# Patient Record
Sex: Female | Born: 1935 | Race: White | Hispanic: No
Health system: Southern US, Community
[De-identification: ages and names within clinical notes are randomized; demographics above are authoritative.]

---

## 2003-06-18 ENCOUNTER — Other Ambulatory Visit: Payer: Self-pay

## 2004-02-07 ENCOUNTER — Other Ambulatory Visit: Payer: Self-pay

## 2004-02-23 ENCOUNTER — Inpatient Hospital Stay: Payer: Self-pay | Admitting: General Practice

## 2004-04-17 ENCOUNTER — Inpatient Hospital Stay: Payer: Self-pay | Admitting: General Practice

## 2005-10-03 ENCOUNTER — Inpatient Hospital Stay: Payer: Self-pay | Admitting: Internal Medicine

## 2005-10-12 ENCOUNTER — Other Ambulatory Visit: Payer: Self-pay

## 2005-10-12 ENCOUNTER — Ambulatory Visit: Payer: Self-pay | Admitting: Unknown Physician Specialty

## 2005-10-15 ENCOUNTER — Ambulatory Visit: Payer: Self-pay | Admitting: Unknown Physician Specialty

## 2006-09-07 ENCOUNTER — Emergency Department: Payer: Self-pay | Admitting: Emergency Medicine

## 2006-09-07 ENCOUNTER — Other Ambulatory Visit: Payer: Self-pay

## 2007-05-02 ENCOUNTER — Emergency Department: Payer: Self-pay | Admitting: Unknown Physician Specialty

## 2007-08-26 IMAGING — CR RIGHT ANKLE - COMPLETE 3+ VIEW
1 series · 5 of 5 positions shown · non-contrast
Comparison: none

REASON FOR EXAM: fall  rm 19
COMMENTS:  LMP: Post-Menopausal

PROCEDURE:     DXR - DXR ANKLE RIGHT COMPLETE  - October 03, 2005  [DATE]
RESULT:     There is noted a fracture of the medial and lateral malleolus.
Some soft tissue swelling is noted.  No dislocation is seen.  Bones appear
mildly osteoporotic.

[Series 1: view not recorded · 0.17mm/px · 5 of 5 slices shown]
[im 1/5]
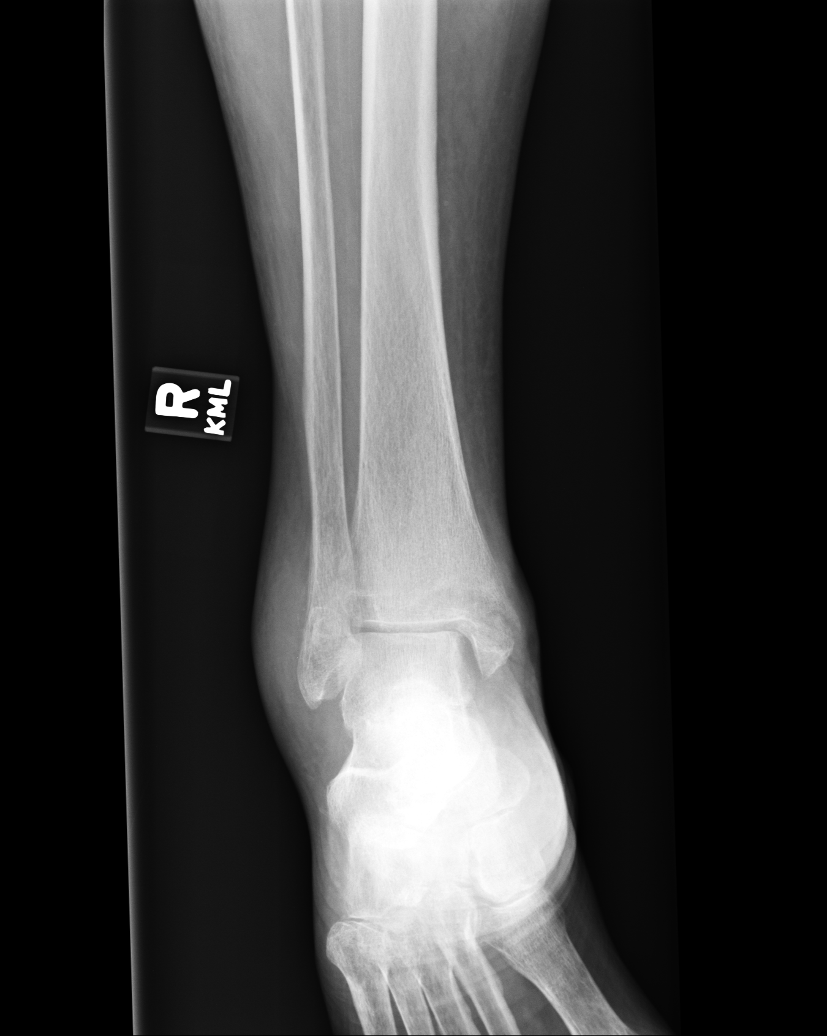
[im 2/5]
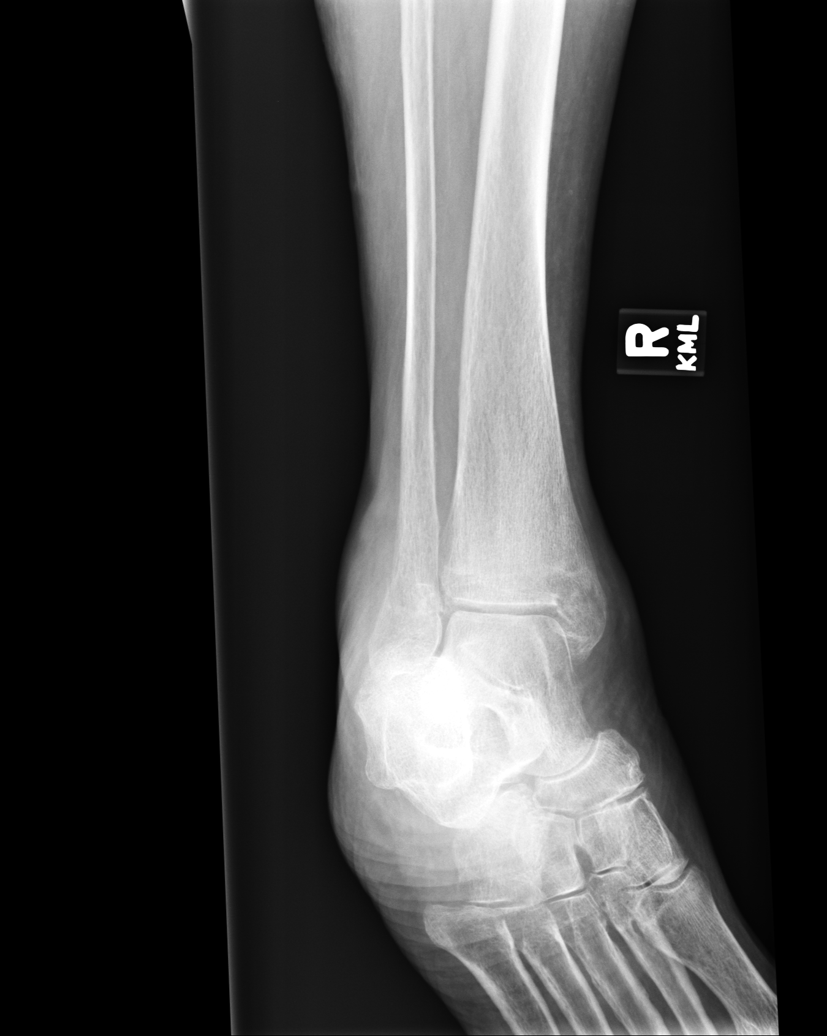
[im 3/5]
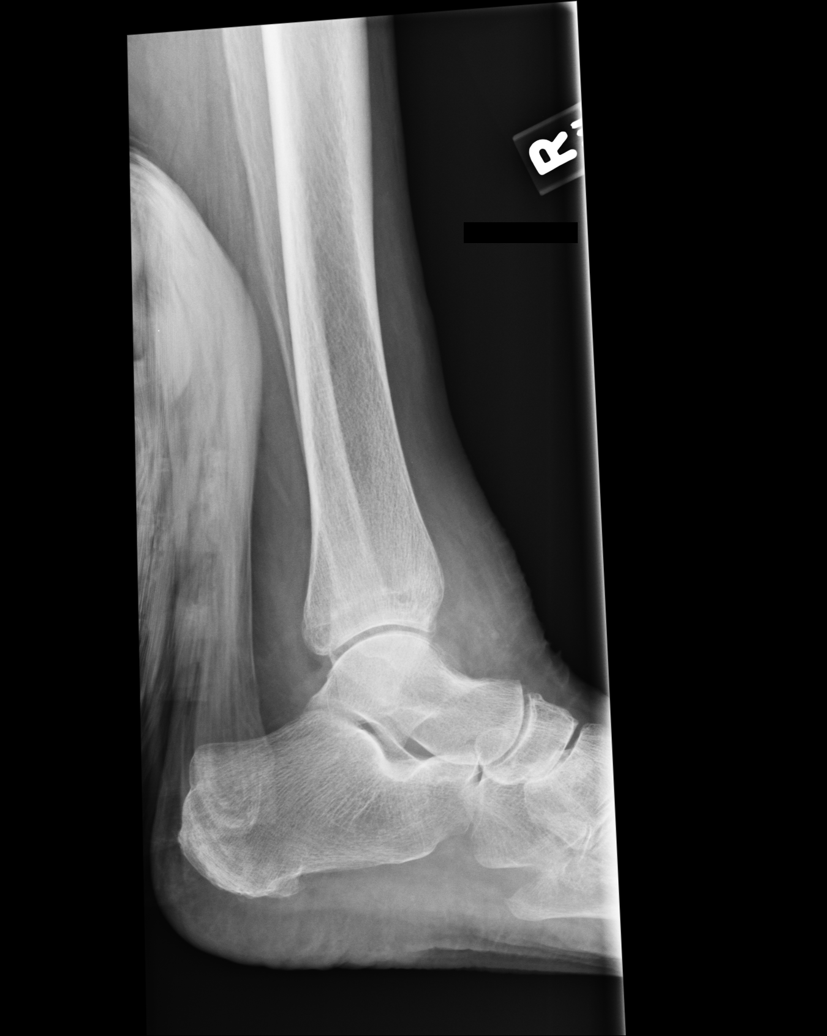
[im 4/5]
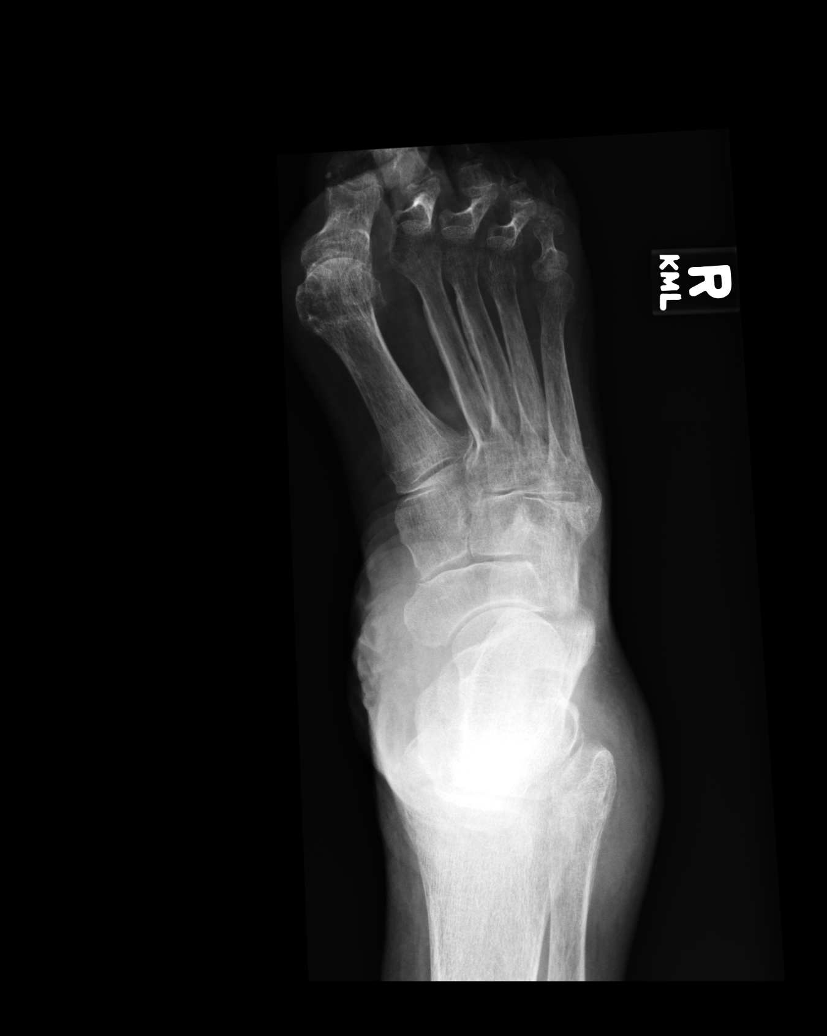
[im 5/5]
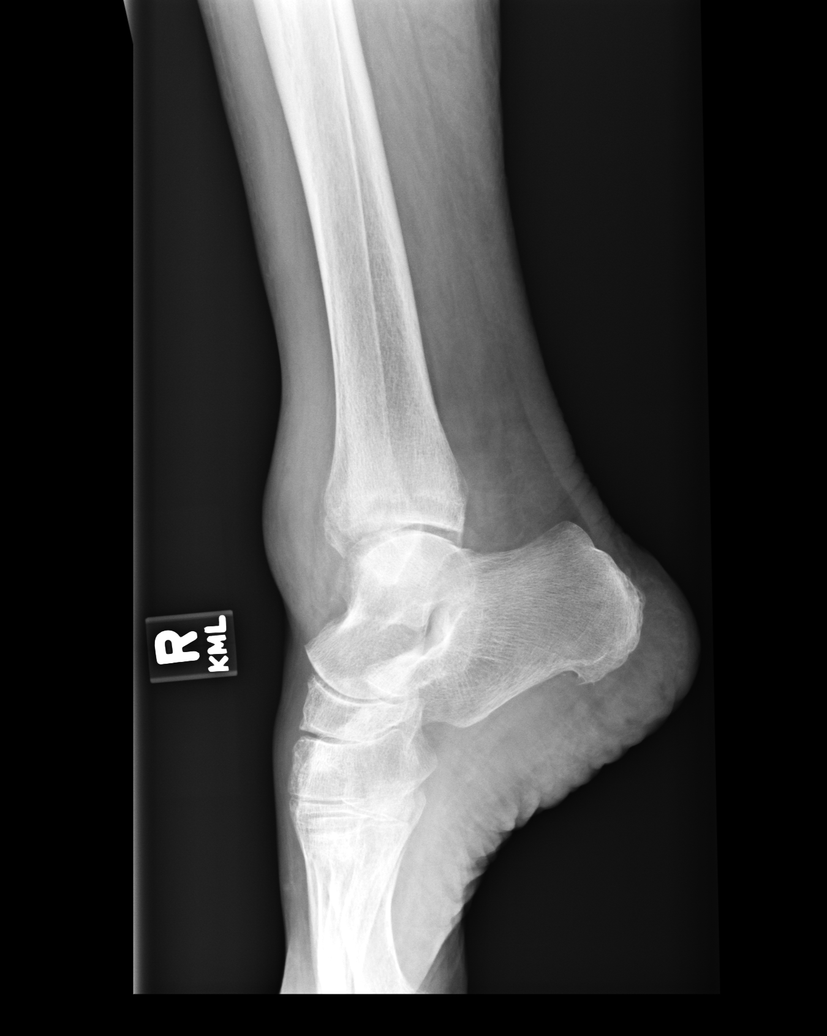

[5 of 5 positions shown; findings below may reference images not displayed]

IMPRESSION: Fracture of the medial lateral malleolus.

## 2008-03-09 ENCOUNTER — Inpatient Hospital Stay: Payer: Self-pay | Admitting: Specialist

## 2008-04-13 ENCOUNTER — Emergency Department: Payer: Self-pay | Admitting: Emergency Medicine

## 2009-06-01 ENCOUNTER — Emergency Department: Payer: Self-pay | Admitting: Emergency Medicine

## 2009-06-07 ENCOUNTER — Inpatient Hospital Stay: Payer: Self-pay | Admitting: Specialist

## 2011-08-19 ENCOUNTER — Inpatient Hospital Stay: Payer: Self-pay | Admitting: Internal Medicine

## 2011-08-19 LAB — COMPREHENSIVE METABOLIC PANEL
Albumin: 3.7 g/dL (ref 3.4–5.0)
Alkaline Phosphatase: 109 U/L (ref 50–136)
Anion Gap: 10 (ref 7–16)
Bilirubin,Total: 0.6 mg/dL (ref 0.2–1.0)
Calcium, Total: 8.5 mg/dL (ref 8.5–10.1)
Chloride: 104 mmol/L (ref 98–107)
Co2: 27 mmol/L (ref 21–32)
Creatinine: 1.04 mg/dL (ref 0.60–1.30)
EGFR (Non-African Amer.): 49 — ABNORMAL LOW
Glucose: 211 mg/dL — ABNORMAL HIGH (ref 65–99)
Osmolality: 287 (ref 275–301)
SGOT(AST): 20 U/L (ref 15–37)
SGPT (ALT): 12 U/L
Sodium: 141 mmol/L (ref 136–145)
Total Protein: 7.7 g/dL (ref 6.4–8.2)

## 2011-08-19 LAB — CBC
HGB: 12.9 g/dL (ref 12.0–16.0)
MCH: 30.8 pg (ref 26.0–34.0)
MCV: 96 fL (ref 80–100)
RBC: 4.18 10*6/uL (ref 3.80–5.20)
RDW: 13.5 % (ref 11.5–14.5)
WBC: 18.9 10*3/uL — ABNORMAL HIGH (ref 3.6–11.0)

## 2011-08-19 LAB — URINALYSIS, COMPLETE
Bilirubin,UR: NEGATIVE
Glucose,UR: 50 mg/dL (ref 0–75)
Ketone: NEGATIVE
Ph: 8 (ref 4.5–8.0)
Protein: 100
WBC UR: 42 /HPF (ref 0–5)

## 2011-08-19 LAB — TSH: Thyroid Stimulating Horm: 9.8 u[IU]/mL — ABNORMAL HIGH

## 2011-08-20 LAB — BASIC METABOLIC PANEL
Anion Gap: 9 (ref 7–16)
Calcium, Total: 8 mg/dL — ABNORMAL LOW (ref 8.5–10.1)
Chloride: 107 mmol/L (ref 98–107)
Co2: 24 mmol/L (ref 21–32)
Creatinine: 1.01 mg/dL (ref 0.60–1.30)
EGFR (African American): 58 — ABNORMAL LOW
Glucose: 101 mg/dL — ABNORMAL HIGH (ref 65–99)
Osmolality: 279 (ref 275–301)
Potassium: 4.2 mmol/L (ref 3.5–5.1)
Sodium: 140 mmol/L (ref 136–145)

## 2011-08-20 LAB — CBC WITH DIFFERENTIAL/PLATELET
Basophil #: 0.1 10*3/uL (ref 0.0–0.1)
Eosinophil #: 0.2 10*3/uL (ref 0.0–0.7)
Eosinophil %: 1.6 %
HGB: 11.7 g/dL — ABNORMAL LOW (ref 12.0–16.0)
Lymphocyte #: 1.4 10*3/uL (ref 1.0–3.6)
Lymphocyte %: 11.2 %
MCH: 31.3 pg (ref 26.0–34.0)
MCV: 97 fL (ref 80–100)
Neutrophil #: 9.6 10*3/uL — ABNORMAL HIGH (ref 1.4–6.5)
Neutrophil %: 78 %
Platelet: 215 10*3/uL (ref 150–440)
RBC: 3.72 10*6/uL — ABNORMAL LOW (ref 3.80–5.20)
RDW: 13.3 % (ref 11.5–14.5)
WBC: 12.3 10*3/uL — ABNORMAL HIGH (ref 3.6–11.0)

## 2011-08-21 LAB — CBC WITH DIFFERENTIAL/PLATELET
Basophil #: 0.1 10*3/uL (ref 0.0–0.1)
Basophil %: 0.7 %
HCT: 37 % (ref 35.0–47.0)
Lymphocyte #: 1.3 10*3/uL (ref 1.0–3.6)
Lymphocyte %: 10.9 %
MCH: 31.5 pg (ref 26.0–34.0)
MCV: 96 fL (ref 80–100)
Monocyte #: 1.2 x10 3/mm — ABNORMAL HIGH (ref 0.2–0.9)
Monocyte %: 10.3 %
Neutrophil %: 74.5 %
Platelet: 236 10*3/uL (ref 150–440)
RBC: 3.85 10*6/uL (ref 3.80–5.20)
WBC: 11.5 10*3/uL — ABNORMAL HIGH (ref 3.6–11.0)

## 2011-08-24 LAB — CREATININE, SERUM
Creatinine: 1.03 mg/dL (ref 0.60–1.30)
EGFR (African American): 57 — ABNORMAL LOW

## 2011-08-24 LAB — CBC WITH DIFFERENTIAL/PLATELET
Basophil %: 0.8 %
Eosinophil #: 0.5 10*3/uL (ref 0.0–0.7)
Eosinophil %: 6 %
HCT: 41.8 % (ref 35.0–47.0)
Lymphocyte %: 11.7 %
MCHC: 31.8 g/dL — ABNORMAL LOW (ref 32.0–36.0)
MCV: 97 fL (ref 80–100)
Monocyte %: 12.1 %
Neutrophil %: 69.4 %
Platelet: 258 10*3/uL (ref 150–440)

## 2012-02-04 ENCOUNTER — Emergency Department: Payer: Self-pay | Admitting: Emergency Medicine

## 2012-02-04 LAB — URINALYSIS, COMPLETE
Bilirubin,UR: NEGATIVE
Blood: NEGATIVE
Glucose,UR: NEGATIVE mg/dL (ref 0–75)
Ph: 7 (ref 4.5–8.0)
Protein: 100
RBC,UR: 12 /HPF (ref 0–5)
Specific Gravity: 1.013 (ref 1.003–1.030)
Squamous Epithelial: <1
WBC UR: 123 /HPF (ref 0–5)

## 2012-02-04 LAB — CBC WITH DIFFERENTIAL/PLATELET
Basophil #: 0.1 10*3/uL (ref 0.0–0.1)
Eosinophil #: 0 10*3/uL (ref 0.0–0.7)
HCT: 39.3 % (ref 35.0–47.0)
HGB: 13 g/dL (ref 12.0–16.0)
Lymphocyte #: 1.5 10*3/uL (ref 1.0–3.6)
Lymphocyte %: 9.2 %
MCH: 31.8 pg (ref 26.0–34.0)
Monocyte %: 5.2 %
Neutrophil #: 13.8 10*3/uL — ABNORMAL HIGH (ref 1.4–6.5)
Neutrophil %: 84.7 %
Platelet: 259 10*3/uL (ref 150–440)
RDW: 14.1 % (ref 11.5–14.5)
WBC: 16.3 10*3/uL — ABNORMAL HIGH (ref 3.6–11.0)

## 2012-02-04 LAB — COMPREHENSIVE METABOLIC PANEL
Albumin: 3.4 g/dL (ref 3.4–5.0)
Anion Gap: 10 (ref 7–16)
Bilirubin,Total: 0.7 mg/dL (ref 0.2–1.0)
Calcium, Total: 8.8 mg/dL (ref 8.5–10.1)
Chloride: 104 mmol/L (ref 98–107)
Co2: 28 mmol/L (ref 21–32)
Creatinine: 0.95 mg/dL (ref 0.60–1.30)
EGFR (African American): >60
EGFR (Non-African Amer.): 54 — ABNORMAL LOW
Glucose: 166 mg/dL — ABNORMAL HIGH (ref 65–99)
Osmolality: 287 (ref 275–301)
Potassium: 4 mmol/L (ref 3.5–5.1)
Sodium: 142 mmol/L (ref 136–145)

## 2012-04-15 ENCOUNTER — Inpatient Hospital Stay: Payer: Self-pay | Admitting: Internal Medicine

## 2012-04-15 LAB — URINALYSIS, COMPLETE
Bacteria: NONE SEEN
Bilirubin,UR: NEGATIVE
Glucose,UR: NEGATIVE mg/dL (ref 0–75)
Ketone: NEGATIVE
Nitrite: NEGATIVE
Ph: 9 (ref 4.5–8.0)
RBC,UR: 182 /HPF (ref 0–5)
Specific Gravity: 1.012 (ref 1.003–1.030)
Squamous Epithelial: 1

## 2012-04-15 LAB — COMPREHENSIVE METABOLIC PANEL
Albumin: 3.5 g/dL (ref 3.4–5.0)
Anion Gap: 8 (ref 7–16)
BUN: 16 mg/dL (ref 7–18)
Calcium, Total: 8.7 mg/dL (ref 8.5–10.1)
EGFR (African American): 59 — ABNORMAL LOW
Glucose: 148 mg/dL — ABNORMAL HIGH (ref 65–99)
Osmolality: 283 (ref 275–301)
Potassium: 3.7 mmol/L (ref 3.5–5.1)
SGOT(AST): 14 U/L — ABNORMAL LOW (ref 15–37)
Sodium: 140 mmol/L (ref 136–145)
Total Protein: 7.4 g/dL (ref 6.4–8.2)

## 2012-04-15 LAB — CBC
HCT: 40.5 % (ref 35.0–47.0)
HGB: 13.7 g/dL (ref 12.0–16.0)
MCH: 33.1 pg (ref 26.0–34.0)
MCHC: 33.7 g/dL (ref 32.0–36.0)
RDW: 13.2 % (ref 11.5–14.5)

## 2012-04-16 LAB — CBC WITH DIFFERENTIAL/PLATELET
Basophil %: 0.4 %
Eosinophil %: 0.1 %
HGB: 12.8 g/dL (ref 12.0–16.0)
Lymphocyte #: 1.1 10*3/uL (ref 1.0–3.6)
Lymphocyte %: 8.4 %
MCH: 33.7 pg (ref 26.0–34.0)
MCV: 98 fL (ref 80–100)
Monocyte #: 1 x10 3/mm — ABNORMAL HIGH (ref 0.2–0.9)
Monocyte %: 7.5 %
Neutrophil %: 83.6 %
Platelet: 232 10*3/uL (ref 150–440)
RDW: 12.7 % (ref 11.5–14.5)
WBC: 13.7 10*3/uL — ABNORMAL HIGH (ref 3.6–11.0)

## 2012-04-16 LAB — BASIC METABOLIC PANEL
Calcium, Total: 8.5 mg/dL (ref 8.5–10.1)
Creatinine: 0.88 mg/dL (ref 0.60–1.30)
EGFR (African American): >60
EGFR (Non-African Amer.): 59 — ABNORMAL LOW
Glucose: 137 mg/dL — ABNORMAL HIGH (ref 65–99)
Sodium: 140 mmol/L (ref 136–145)

## 2012-04-16 LAB — LIPID PANEL
Cholesterol: 209 mg/dL — ABNORMAL HIGH (ref 0–200)
HDL Cholesterol: 47 mg/dL (ref 40–60)
Triglycerides: 85 mg/dL (ref 0–200)
VLDL Cholesterol, Calc: 17 mg/dL (ref 5–40)

## 2012-04-18 LAB — CBC WITH DIFFERENTIAL/PLATELET
Basophil %: 1 %
Eosinophil #: 0.4 10*3/uL (ref 0.0–0.7)
Eosinophil %: 4.9 %
HGB: 13.4 g/dL (ref 12.0–16.0)
Lymphocyte #: 1.3 10*3/uL (ref 1.0–3.6)
MCH: 34.3 pg — ABNORMAL HIGH (ref 26.0–34.0)
MCHC: 34.6 g/dL (ref 32.0–36.0)
Monocyte #: 1.1 x10 3/mm — ABNORMAL HIGH (ref 0.2–0.9)
Monocyte %: 12.4 %
Neutrophil #: 6.1 10*3/uL (ref 1.4–6.5)
Neutrophil %: 67.2 %
RBC: 3.9 10*6/uL (ref 3.80–5.20)

## 2012-04-18 LAB — URINE CULTURE

## 2012-05-18 ENCOUNTER — Other Ambulatory Visit: Payer: Self-pay

## 2012-05-18 LAB — URINALYSIS, COMPLETE
Bilirubin,UR: NEGATIVE
Glucose,UR: NEGATIVE mg/dL (ref 0–75)
Protein: 30
RBC,UR: 7 /HPF (ref 0–5)
Specific Gravity: 1.008 (ref 1.003–1.030)
Squamous Epithelial: <1
WBC UR: 65 /HPF (ref 0–5)

## 2012-05-20 LAB — URINE CULTURE

## 2012-06-21 ENCOUNTER — Other Ambulatory Visit: Payer: Self-pay

## 2012-06-21 LAB — URINALYSIS, COMPLETE
Bilirubin,UR: NEGATIVE
Glucose,UR: NEGATIVE mg/dL (ref 0–75)
Nitrite: POSITIVE
Protein: 100
Squamous Epithelial: 3
WBC UR: 1002 /HPF (ref 0–5)

## 2013-04-11 ENCOUNTER — Other Ambulatory Visit: Payer: Self-pay | Admitting: Family Medicine

## 2013-04-11 LAB — BASIC METABOLIC PANEL
Anion Gap: 4 — ABNORMAL LOW (ref 7–16)
BUN: 22 mg/dL — ABNORMAL HIGH (ref 7–18)
Co2: 28 mmol/L (ref 21–32)
Glucose: 121 mg/dL — ABNORMAL HIGH (ref 65–99)
Sodium: 138 mmol/L (ref 136–145)

## 2013-04-15 ENCOUNTER — Other Ambulatory Visit: Payer: Self-pay | Admitting: Family Medicine

## 2013-04-15 LAB — GENTAMICIN LEVEL, RANDOM: Gentamicin, Random: 1 ug/mL

## 2013-05-01 ENCOUNTER — Ambulatory Visit: Payer: Self-pay | Admitting: Family Medicine

## 2013-06-03 LAB — BASIC METABOLIC PANEL
Anion Gap: 4 — ABNORMAL LOW (ref 7–16)
BUN: 24 mg/dL — ABNORMAL HIGH (ref 7–18)
CHLORIDE: 105 mmol/L (ref 98–107)
CREATININE: 1.26 mg/dL (ref 0.60–1.30)
Calcium, Total: 8 mg/dL — ABNORMAL LOW (ref 8.5–10.1)
Co2: 25 mmol/L (ref 21–32)
EGFR (African American): 44 — ABNORMAL LOW
GFR CALC NON AF AMER: 38 — AB
GLUCOSE: 130 mg/dL — AB (ref 65–99)
Osmolality: 274 (ref 275–301)
Potassium: 4.6 mmol/L (ref 3.5–5.1)
Sodium: 134 mmol/L — ABNORMAL LOW (ref 136–145)

## 2013-06-03 LAB — CBC
HCT: 33.9 % — AB (ref 35.0–47.0)
HGB: 11.2 g/dL — ABNORMAL LOW (ref 12.0–16.0)
MCH: 31.9 pg (ref 26.0–34.0)
MCHC: 32.9 g/dL (ref 32.0–36.0)
MCV: 97 fL (ref 80–100)
Platelet: 313 10*3/uL (ref 150–440)
RBC: 3.5 10*6/uL — AB (ref 3.80–5.20)
RDW: 13 % (ref 11.5–14.5)
WBC: 23.4 10*3/uL — AB (ref 3.6–11.0)

## 2013-06-03 LAB — URINALYSIS, COMPLETE
BILIRUBIN, UR: NEGATIVE
Blood: NEGATIVE
GLUCOSE, UR: NEGATIVE mg/dL (ref 0–75)
Ketone: NEGATIVE
Nitrite: POSITIVE
PH: 6 (ref 4.5–8.0)
PROTEIN: NEGATIVE
RBC,UR: 7 /HPF (ref 0–5)
Specific Gravity: 1.015 (ref 1.003–1.030)
Squamous Epithelial: 1
WBC UR: 149 /HPF (ref 0–5)

## 2013-06-05 LAB — CBC WITH DIFFERENTIAL/PLATELET
Basophil #: 0.1 10*3/uL (ref 0.0–0.1)
Basophil %: 1.1 %
Eosinophil #: 0.2 10*3/uL (ref 0.0–0.7)
Eosinophil %: 1.4 %
HCT: 29.8 % — ABNORMAL LOW (ref 35.0–47.0)
HGB: 9.9 g/dL — ABNORMAL LOW (ref 12.0–16.0)
LYMPHS PCT: 4.6 %
Lymphocyte #: 0.6 10*3/uL — ABNORMAL LOW (ref 1.0–3.6)
MCH: 32.7 pg (ref 26.0–34.0)
MCHC: 33.2 g/dL (ref 32.0–36.0)
MCV: 98 fL (ref 80–100)
Monocyte #: 1.5 x10 3/mm — ABNORMAL HIGH (ref 0.2–0.9)
Monocyte %: 11.7 %
NEUTROS ABS: 10.3 10*3/uL — AB (ref 1.4–6.5)
Neutrophil %: 81.2 %
Platelet: 234 10*3/uL (ref 150–440)
RBC: 3.02 10*6/uL — ABNORMAL LOW (ref 3.80–5.20)
RDW: 13 % (ref 11.5–14.5)
WBC: 12.7 10*3/uL — AB (ref 3.6–11.0)

## 2013-06-05 LAB — URINE CULTURE

## 2013-06-06 ENCOUNTER — Inpatient Hospital Stay: Payer: Self-pay | Admitting: Surgery

## 2013-06-09 LAB — CBC WITH DIFFERENTIAL/PLATELET
BASOS PCT: 1 %
Basophil #: 0.1 10*3/uL (ref 0.0–0.1)
Eosinophil #: 0.1 10*3/uL (ref 0.0–0.7)
Eosinophil %: 1.3 %
HCT: 28.5 % — AB (ref 35.0–47.0)
HGB: 9.6 g/dL — AB (ref 12.0–16.0)
LYMPHS PCT: 24.7 %
Lymphocyte #: 1.3 10*3/uL (ref 1.0–3.6)
MCH: 32.8 pg (ref 26.0–34.0)
MCHC: 33.8 g/dL (ref 32.0–36.0)
MCV: 97 fL (ref 80–100)
MONO ABS: 0.8 x10 3/mm (ref 0.2–0.9)
MONOS PCT: 14.8 %
NEUTROS ABS: 3 10*3/uL (ref 1.4–6.5)
NEUTROS PCT: 58.2 %
Platelet: 220 10*3/uL (ref 150–440)
RBC: 2.93 10*6/uL — AB (ref 3.80–5.20)
RDW: 13.1 % (ref 11.5–14.5)
WBC: 5.2 10*3/uL (ref 3.6–11.0)

## 2013-06-09 LAB — COMPREHENSIVE METABOLIC PANEL
ALT: 11 U/L — AB (ref 12–78)
Albumin: 2.9 g/dL — ABNORMAL LOW (ref 3.4–5.0)
Alkaline Phosphatase: 58 U/L
Anion Gap: 6 — ABNORMAL LOW (ref 7–16)
BUN: 20 mg/dL — AB (ref 7–18)
Bilirubin,Total: 0.3 mg/dL (ref 0.2–1.0)
CHLORIDE: 103 mmol/L (ref 98–107)
CREATININE: 1.36 mg/dL — AB (ref 0.60–1.30)
Calcium, Total: 8 mg/dL — ABNORMAL LOW (ref 8.5–10.1)
Co2: 27 mmol/L (ref 21–32)
EGFR (African American): 40 — ABNORMAL LOW
GFR CALC NON AF AMER: 35 — AB
GLUCOSE: 59 mg/dL — AB (ref 65–99)
Osmolality: 272 (ref 275–301)
Potassium: 4.5 mmol/L (ref 3.5–5.1)
SGOT(AST): 19 U/L (ref 15–37)
Sodium: 136 mmol/L (ref 136–145)
TOTAL PROTEIN: 6.1 g/dL — AB (ref 6.4–8.2)

## 2013-06-10 LAB — BASIC METABOLIC PANEL
Anion Gap: 5 — ABNORMAL LOW (ref 7–16)
BUN: 20 mg/dL — AB (ref 7–18)
CREATININE: 1.58 mg/dL — AB (ref 0.60–1.30)
Calcium, Total: 8 mg/dL — ABNORMAL LOW (ref 8.5–10.1)
Chloride: 104 mmol/L (ref 98–107)
Co2: 28 mmol/L (ref 21–32)
EGFR (African American): 34 — ABNORMAL LOW
EGFR (Non-African Amer.): 29 — ABNORMAL LOW
Glucose: 61 mg/dL — ABNORMAL LOW (ref 65–99)
Osmolality: 274 (ref 275–301)
POTASSIUM: 4.7 mmol/L (ref 3.5–5.1)
Sodium: 137 mmol/L (ref 136–145)

## 2013-06-11 LAB — BASIC METABOLIC PANEL
Anion Gap: 4 — ABNORMAL LOW (ref 7–16)
BUN: 17 mg/dL (ref 7–18)
Calcium, Total: 8.1 mg/dL — ABNORMAL LOW (ref 8.5–10.1)
Chloride: 103 mmol/L (ref 98–107)
Co2: 26 mmol/L (ref 21–32)
Creatinine: 1.62 mg/dL — ABNORMAL HIGH (ref 0.60–1.30)
EGFR (African American): 33 — ABNORMAL LOW
EGFR (Non-African Amer.): 28 — ABNORMAL LOW
Glucose: 56 mg/dL — ABNORMAL LOW (ref 65–99)
Osmolality: 266 (ref 275–301)
Potassium: 4.3 mmol/L (ref 3.5–5.1)
Sodium: 133 mmol/L — ABNORMAL LOW (ref 136–145)

## 2014-01-25 ENCOUNTER — Observation Stay: Payer: Self-pay | Admitting: Internal Medicine

## 2014-01-25 LAB — URINALYSIS, COMPLETE
Bilirubin,UR: NEGATIVE
Blood: NEGATIVE
GLUCOSE, UR: NEGATIVE mg/dL (ref 0–75)
Hyaline Cast: 7
Ketone: NEGATIVE
Nitrite: NEGATIVE
Ph: 6 (ref 4.5–8.0)
Specific Gravity: 1.01 (ref 1.003–1.030)
Squamous Epithelial: 2

## 2014-01-25 LAB — COMPREHENSIVE METABOLIC PANEL
ALBUMIN: 3.2 g/dL — AB (ref 3.4–5.0)
ALK PHOS: 83 U/L
Anion Gap: 6 — ABNORMAL LOW (ref 7–16)
BILIRUBIN TOTAL: 0.4 mg/dL (ref 0.2–1.0)
BUN: 28 mg/dL — ABNORMAL HIGH (ref 7–18)
CALCIUM: 8.9 mg/dL (ref 8.5–10.1)
Chloride: 111 mmol/L — ABNORMAL HIGH (ref 98–107)
Co2: 26 mmol/L (ref 21–32)
Creatinine: 2.06 mg/dL — ABNORMAL HIGH (ref 0.60–1.30)
EGFR (African American): 24 — ABNORMAL LOW
EGFR (Non-African Amer.): 21 — ABNORMAL LOW
Glucose: 78 mg/dL (ref 65–99)
OSMOLALITY: 289 (ref 275–301)
Potassium: 5 mmol/L (ref 3.5–5.1)
SGOT(AST): 29 U/L (ref 15–37)
SGPT (ALT): 12 U/L — ABNORMAL LOW
SODIUM: 143 mmol/L (ref 136–145)
Total Protein: 7.1 g/dL (ref 6.4–8.2)

## 2014-01-25 LAB — CBC WITH DIFFERENTIAL/PLATELET
Basophil #: 0 10*3/uL (ref 0.0–0.1)
Basophil %: 0.4 %
EOS PCT: 1 %
Eosinophil #: 0.1 10*3/uL (ref 0.0–0.7)
HCT: 39.7 % (ref 35.0–47.0)
HGB: 12.7 g/dL (ref 12.0–16.0)
LYMPHS PCT: 14 %
Lymphocyte #: 1.4 10*3/uL (ref 1.0–3.6)
MCH: 31.5 pg (ref 26.0–34.0)
MCHC: 32 g/dL (ref 32.0–36.0)
MCV: 98 fL (ref 80–100)
Monocyte #: 0.9 x10 3/mm (ref 0.2–0.9)
Monocyte %: 8.8 %
Neutrophil #: 7.6 10*3/uL — ABNORMAL HIGH (ref 1.4–6.5)
Neutrophil %: 75.8 %
PLATELETS: 231 10*3/uL (ref 150–440)
RBC: 4.04 10*6/uL (ref 3.80–5.20)
RDW: 14 % (ref 11.5–14.5)
WBC: 10 10*3/uL (ref 3.6–11.0)

## 2014-01-25 LAB — TROPONIN I: Troponin-I: <0.02 ng/mL

## 2014-01-26 LAB — BASIC METABOLIC PANEL
Anion Gap: 3 — ABNORMAL LOW (ref 7–16)
BUN: 22 mg/dL — ABNORMAL HIGH (ref 7–18)
Calcium, Total: 7.7 mg/dL — ABNORMAL LOW (ref 8.5–10.1)
Chloride: 115 mmol/L — ABNORMAL HIGH (ref 98–107)
Co2: 26 mmol/L (ref 21–32)
Creatinine: 1.65 mg/dL — ABNORMAL HIGH (ref 0.60–1.30)
EGFR (African American): 32 — ABNORMAL LOW
EGFR (Non-African Amer.): 27 — ABNORMAL LOW
Glucose: 77 mg/dL (ref 65–99)
Osmolality: 289 (ref 275–301)
Potassium: 4.6 mmol/L (ref 3.5–5.1)
Sodium: 144 mmol/L (ref 136–145)

## 2014-01-27 LAB — URINE CULTURE

## 2014-08-10 ENCOUNTER — Other Ambulatory Visit: Admit: 2014-08-10 | Disposition: A | Discharge status: Home / Self Care | Payer: Self-pay

## 2014-08-10 LAB — URINALYSIS, COMPLETE
BILIRUBIN, UR: NEGATIVE
BLOOD: NEGATIVE
Bacteria: NONE SEEN
Glucose,UR: NEGATIVE mg/dL (ref 0–75)
Ketone: NEGATIVE
Nitrite: POSITIVE
Ph: 5 (ref 4.5–8.0)
Protein: 100
RBC,UR: NONE SEEN /HPF (ref 0–5)
SPECIFIC GRAVITY: 1.016 (ref 1.003–1.030)
Squamous Epithelial: 4
WBC UR: 5132 /HPF (ref 0–5)

## 2014-08-11 ENCOUNTER — Emergency Department: Admit: 2014-08-11 | Disposition: A | Discharge status: Home / Self Care | Payer: Self-pay | Admitting: Internal Medicine

## 2014-08-11 LAB — COMPREHENSIVE METABOLIC PANEL
AST: 25 U/L
Albumin: 3.5 g/dL
Alkaline Phosphatase: 46 U/L
Anion Gap: 6 — ABNORMAL LOW (ref 7–16)
BUN: 23 mg/dL — AB
Bilirubin,Total: 0.4 mg/dL
Calcium, Total: 9.3 mg/dL
Chloride: 104 mmol/L
Co2: 27 mmol/L
Creatinine: 1.02 mg/dL — ABNORMAL HIGH
EGFR (African American): 56 — ABNORMAL LOW
EGFR (Non-African Amer.): 49 — ABNORMAL LOW
GLUCOSE: 158 mg/dL — AB
POTASSIUM: 4.4 mmol/L
SGPT (ALT): 11 U/L — ABNORMAL LOW
Sodium: 137 mmol/L
Total Protein: 6.5 g/dL

## 2014-08-11 LAB — CBC
HCT: 34 % — ABNORMAL LOW (ref 35.0–47.0)
HGB: 11.3 g/dL — ABNORMAL LOW (ref 12.0–16.0)
MCH: 32.6 pg (ref 26.0–34.0)
MCHC: 33.3 g/dL (ref 32.0–36.0)
MCV: 98 fL (ref 80–100)
PLATELETS: 246 10*3/uL (ref 150–440)
RBC: 3.47 10*6/uL — ABNORMAL LOW (ref 3.80–5.20)
RDW: 13.5 % (ref 11.5–14.5)
WBC: 9.7 10*3/uL (ref 3.6–11.0)

## 2014-08-11 LAB — PROTIME-INR
INR: 1
PROTHROMBIN TIME: 13.5 s

## 2014-08-11 LAB — TROPONIN I

## 2014-08-12 LAB — URINE CULTURE

## 2014-08-16 ENCOUNTER — Other Ambulatory Visit: Admit: 2014-08-16 | Disposition: A | Discharge status: Home / Self Care | Payer: Self-pay

## 2014-08-16 LAB — CBC WITH DIFFERENTIAL/PLATELET
Basophil #: 0.1 10*3/uL (ref 0.0–0.1)
Basophil %: 1.1 %
EOS PCT: 5.9 %
Eosinophil #: 0.6 10*3/uL (ref 0.0–0.7)
HCT: 36.3 % (ref 35.0–47.0)
HGB: 12.2 g/dL (ref 12.0–16.0)
LYMPHS ABS: 2.3 10*3/uL (ref 1.0–3.6)
Lymphocyte %: 21.8 %
MCH: 32.9 pg (ref 26.0–34.0)
MCHC: 33.6 g/dL (ref 32.0–36.0)
MCV: 98 fL (ref 80–100)
MONO ABS: 1.1 x10 3/mm — AB (ref 0.2–0.9)
Monocyte %: 10.2 %
NEUTROS ABS: 6.4 10*3/uL (ref 1.4–6.5)
Neutrophil %: 61 %
Platelet: 257 10*3/uL (ref 150–440)
RBC: 3.71 10*6/uL — ABNORMAL LOW (ref 3.80–5.20)
RDW: 13.3 % (ref 11.5–14.5)
WBC: 10.5 10*3/uL (ref 3.6–11.0)

## 2014-08-16 LAB — BASIC METABOLIC PANEL
Anion Gap: 6 — ABNORMAL LOW (ref 7–16)
BUN: 22 mg/dL — ABNORMAL HIGH
CALCIUM: 8.9 mg/dL
Chloride: 104 mmol/L
Co2: 28 mmol/L
Creatinine: 1.09 mg/dL — ABNORMAL HIGH
EGFR (African American): 52 — ABNORMAL LOW
EGFR (Non-African Amer.): 45 — ABNORMAL LOW
Glucose: 99 mg/dL
POTASSIUM: 4.1 mmol/L
SODIUM: 138 mmol/L

## 2014-08-24 NOTE — Discharge Summary (Signed)
PATIENT NAME:  Angela Bean, Angela Bean MR#:  [id number] DATE OF BIRTH:  [birth year]  DATE OF ADMISSION:  04/15/2012 DATE OF DISCHARGE:  04/18/2012   PRIMARY CARE PHYSICIAN: Dr. Randa LynnLamb   DISCHARGE DIAGNOSES:  1. Altered mental status, metabolic encephalopathy, possibly due to cystitis.  2. Cystitis.  3. Frequent falls.  4. Hypertension.  5. Hypothyroidism.   CODE STATUS: DO NOT RESUSCITATE.   CONDITION: Stable.   HOME MEDICATIONS:  1. Synthroid 25 mcg p.o. daily.  2. Aspirin 81 mg p.o. daily.  3. Lisinopril 5 mg p.o. daily.  4. Magnesium hydroxide 8% oral suspension 30 mL every 12 hours p.r.n.  5. Augmentin 500 mg/125 mg p.o. tablets 1 tablet b.i.d. for three days.   DIET: Low sodium diet.   ACTIVITY: As tolerated.   FOLLOW-UP CARE: Follow-up with PCP within 1 to 2 weeks.   REASON FOR ADMISSION: Altered mental status and fall.   HOSPITAL COURSE: The patient is an 81 year old Caucasian female with Bean history of hypertension, hyperlipidemia, hypothyroidism, and recent abdominal aortic aneurysm diagnosed in September 2013. She was brought to the ED due to fall. The patient was confused and brought to the ED for further evaluation. The patient's urinalysis showed urinary tract infection. CAT scan of head was negative. Chest x-ray, lower extremity and pelvic x-ray showed no fracture. The patient was admitted for urinary tract infection and altered mental status possibly due to urinary tract infection. Rocephin was initiated. After antibiotic treatment, the patient's symptoms have much improved. Mental status recovered to her baseline. Urine culture showed positive E. coli and Proteus mirabilis both sensitive to Ampicillin so Rocephin was discontinued and we are going to start Augmentin p.o. for three days after discharge.   Per physical therapy evaluation, the patient needed to be placed in subacute rehab. The patient has no symptoms. Vital signs are stable. She is clinically stable and will be  discharged to subacute rehab today.   I discussed the patient's discharge plan with the patient's daughter and case Production designer, theatre/television/filmmanager.   TIME SPENT: About 35 minutes.   ____________________________ Shaune PollackQing Que Meneely, MD qc:drc D: 04/18/2012 08:56:43 ET T: 04/18/2012 09:14:15 ET JOB#: [id number]  cc: Shaune PollackQing Jasnoor Trussell, MD, <Dictator> Reola MosherAndrew S. Randa LynnLamb, MD Shaune PollackQING Capone Schwinn MD ELECTRONICALLY SIGNED 04/19/2012 12:40

## 2014-08-24 NOTE — H&P (Signed)
PATIENT NAME:  Angela Bean, VILLAMAR MR#:  [id number] DATE OF BIRTH:  01/22/1924  DATE OF ADMISSION:  04/15/2012  PRIMARY CARE PHYSICIAN:  Dr. Alonna Buckler. REFERRING PHYSICIAN: Dr. Cyril Loosen.  CHIEF COMPLAINT: Altered mental status and fall.   HISTORY OF PRESENT ILLNESS: The patient is an 81 year old Caucasian female with a past medical history of hypertension, hyperlipidemia, hypothyroidism and recent history of abdominal aortic aneurysm diagnosed in September 2013. She was brought into the ER via EMS after she sustained a fall. The patient's daughter and son were at bedside and they are reporting that the patient lives alone and uses wheelchair and walker for ambulation. She sustained an unwitnessed fall today and she was lying on the floor for unknown amount of time as reported by the patient's daughter. 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 They had called her daughter and daughter went to check on her. By the time daughter went to see her, she was on the floor, but she was awake and alert but confused. Daughter called EMS and she was brought into the ER. The patient was diagnosed with urinary tract infection as urinalysis was positive for leukocyte esterase. CT of head is negative. She had chest x-ray and multiple lower extremity and pelvic x-rays done and fractures were ruled out. Family is worried as the patient is confused and she is frequently falling. The patient was found to be confused for the past 2 to 3 days and noticed her using her remote control as phone two nights ago. The patient has revealed IV Rocephin for possible urinary tract infection. Hospitalist team is called to admit the patient. As the patient lives alone and falling frequently, family is requesting placement. The patient's code status is DO NOT RESUSCITATE and son and daughter wishes to continue her code status as DO NOT RESUSCITATE. The patient is answering a few questions, but she is very hard of hearing and seemed  to be confused. The patient denied any nausea, vomiting, abdominal pain, diarrhea, or constipation. Denies any chest pain or shortness of breath.   PAST MEDICAL HISTORY:  1. Hypertension. 2. Hyperlipidemia. 3. Hypothyroidism. 4. Recent history of abdominal aortic aneurysm diagnosed in September 2013.   PAST SURGICAL HISTORY:  1. Hysterectomy. 2. Appendectomy. 3. Cholecystectomy. 4. Right total knee replacement. 5. Right ankle open reduction and internal fixation.   PSYCHOSOCIAL HISTORY: Lives alone. According to the patient's daughter, she is not a smoker. Does not take any alcohol or illicit drugs.   FAMILY HISTORY: Mother had history of non-Hodgkin's lymphoma and diabetes mellitus. Dad had history of pancreatic cancer and deceased with pancreatic cancer.   HOME MEDICATIONS:  1. Lisinopril 5 mg p.o. once daily.  2. Aspirin 81 mg p.o. once daily.  3. Synthroid 25 mcg p.o. once daily. 4. Though Bactrim and Keflex were documented in the patient's medication list, according to daughter the patient was not taking any antibiotics.   REVIEW OF SYSTEMS:  Only a few points were obtained as the patient seemed to be confused and answering a few questions appropriately. CONSTITUTIONAL: Denies any fever, fatigue, weakness. EYES: Denies any blurry vision, glaucoma or cataracts. ENT: Denies tinnitus, ear discharge, postnasal drip, difficulty in swallowing OR epistaxis. RESPIRATORY: Denies cough, wheezing, chronic obstructive pulmonary disease, dyspnea, asthma. CARDIOVASCULAR: Denies chest pain, orthopnea, palpitations, or varicose veins. GASTROINTESTINAL: Denies nausea, vomiting, diarrhea, abdominal pain, hematemesis, melena, constipation. GENITOURINARY: Denies any dysuria, hematuria. GYN: Denies any breast mass or vaginal discharge. ENDOCRINE: Denies polyuria, polyphagia, polydipsia. Denies any thyroid  problems. HEMATOLOGIC/LYMPHATIC: Denies anemia, easy bruising, bleeding. INTEGUMENTARY: Denies acne,  rash, lesions. MUSCULOSKELETAL: Denies any pain in the neck, back, or shoulder. Positive arthritis. NEUROLOGIC: The patient seemed to be intermittently pleasantly confused, but answering a few questions appropriately. Denies any vertigo, but positive frequent falls. PSYCH: Denies anxiety, nervousness, insomnia.   PHYSICAL EXAMINATION:  VITAL SIGNS: Temperature 98 degrees Fahrenheit, pulse 62, respiratory rate 20, blood pressure 159/82, sats 95 to 96% on 2 liters.   GENERAL APPEARANCE: Not in acute distress, moderately built and moderately nourished, very hard of hearing.   HEENT: Normocephalic. Pupils are equal, reactive to light and accommodation. No scleral icterus. No conjunctival injection. No postnasal drip. No sinus tenderness. Dry mucous membranes. No postnasal drip. No pharyngeal edema.   NECK: Supple. No JVD. No carotid bruits. No thyromegaly.   LUNGS: Clear to auscultation bilaterally. No crackles. No wheezing. No chest wall tenderness on palpation. No accessory muscle usage.   CARDIAC: S1, S2 normal. Regular rate and rhythm. No pedal edema. Distal pulses are 2+.   ABDOMEN: Soft. Bowel sounds are positive in all four quadrants. Nontender, nondistended. No masses felt.   EXTREMITIES: Right ankle is angulated and externally rotated from the previous surgery which is chronic in nature as reported by the patient's daughter.   NEUROLOGIC: Awake, alert, oriented X2, cooperative. Cranial nerves 2-12 are grossly intact. Motor and sensory are grossly intact. Reflexes are 2+.   SKIN: Warm to touch. No rashes. Positive superficial skin tear on the right knee area probably from the fall. No rashes noticed. No jaundice.   MUSCULOSKELETAL: No joint swelling, effusion, tenderness or erythema   PSYCH: Flat affect, normal mood.   LABORATORY, RADIOLOGICAL AND DIAGNOSTIC DATA: CT of head: No acute findings. Chest x-ray: No acute findings. Right ankle, right hip, right knee: No acute findings were  noticed. Sacrum, coccyx and lumbar spine: No acute findings as per ER preliminary report.  Glucose 148, BUN 16, creatinine 1.0, sodium 140, potassium 3.7, chloride 105, CO2 27, GFR 59, anion gap 8. Serum osmolality 283, calcium 8.7, WBC 11.0, hemoglobin 13.7, hematocrit 40.5, platelet count 229,000, MCV 98, MCH 33.1, AST 14, ALT 12, total protein 7.4, albumin 3.5, total bilirubin 0.7, alkaline phosphatase 87. Urinalysis: Yellow in color, cloudy in appearance. Negative glucose, negative bilirubin, negative ketones, specific gravity 1.012. PH 9.0, blood 2+, protein 100 mg/dL, nitrite negative, leukocyte esterase 3+. Mucous present.   ASSESSMENT AND PLAN: 81 year old female brought in to the ER after she sustained a fall and found to be confused. Will be admitted with the following assessment and plan.  1. Altered mental status most probably from acute cystitis. CT of head is negative. Will get neuro checks, close monitoring, will give her 1 liter of IV fluids.  2. Acute cystitis. Will give her IV Rocephin. Urine cultures are pending and we will give her one liter of IV fluids.  3. Frequent falls. PT consult is placed for gait evaluation. Will check CPK in a.m. as the patient had unwitnessed fall and was lying on the floor for an unknown amount of time. Will rule out rhabdomyolysis.  4. Case management consult for possible placement.  5. Hypertension, chronic in nature. Will continue lisinopril, which is her home medication and titrate medications on an as needed basis. Also, will give her Lopressor 5 mg IV every six hours on an as needed basis for systolic blood pressure greater than 150.  6. Hypothyroidism. Continue Synthroid.  7. History of abdominal aortic aneurysm. The patient being  DO NOT RESUSCITATE and elderly, family is not considering further evaluation and surgical options.  8. Code status will be continued as DO NOT RESUSCITATE.  9. Will provide her gastrointestinal and deep vein thrombosis  prophylaxis with Ranitidine and subcutaneous Lovenox.   The diagnosis and plan of care was discussed in detail with the patient and the patient's daughter and son who are at bedside. They were very understanding of the plan.   TOTAL TIME SPENT ON ADMISSION: 50 minutes.   ____________________________ Ramonita Lab, MD ag:ap D: 04/16/2012 00:13:42 ET T: 04/16/2012 07:06:11 ET JOB#: [id number]  cc: Ramonita Lab, MD, <Dictator> Reola Mosher. Randa Lynn, MD Ramonita Lab MD ELECTRONICALLY SIGNED 04/17/2012 1:30

## 2014-08-28 NOTE — Discharge Summary (Signed)
PATIENT NAME:  Angela Bean, Angela Bean MR#:  [id number] DATE OF BIRTH:  03-17-1924  DATE OF ADMISSION:  06/03/2013 DATE OF DISCHARGE:    DISCHARGE DIAGNOSES: 1.  Large right leg laceration status post debridement and partial closure.  2.  Urinary tract infection.  3.  History of multiple orthopedic surgeries.  4.  Diabetes mellitus.  5.  Osteoarthritis.  6.  Hypercholesterolemia.  7.  Hypertension.  8.  History of aortic abdominal aneurysm.   9.  Status post appendectomy.  10.  Status post cholecystectomy.   DISCHARGE MEDICATIONS:   1.  Synthroid 25 mcg by mouth daily.  2.  Aspirin 81 by mouth daily.  3.  Lisinopril 5 mg by mouth daily.  4.  Prednisone 10 mg by mouth daily.  5.  DuoNeb inhaled three times daily.  6.  Celexa 10 mg by mouth daily. 7.  Acidophilous one cap by mouth twice daily.  8.  Robitussin for chest congestion 20 mL 4 times daily as needed.  9.  Tylenol 325 2 by mouth q. 6 hours as needed pain.  10.  Milk of magnesia 30 mL twice daily as needed constipation.  11.  Lisinopril 1 tab by mouth daily.  12.  Tramadol 50 mg by mouth q. 6 hours as needed pain.  13.  Augmentin 1 tab by mouth twice daily.    INDICATION FOR ADMISSION:  Angela Bean is Bean pleasant 81 year old female with Bean complex traumatic laceration following an injury obtained during transfer.  She was thus admitted for attempted wound washout and closure.   HOSPITAL COURSE:  As follows:  Angela Bean was admitted from the Emergency Room with Bean large laceration which was thought to be too large to attempt to close in the ER.  She was brought to the Operating Room suite for Bean partial closure and debridement.  She also was noted to have Bean urinary tract infection which may have caused some confusion which ultimately resulted in this injury and therefore was begun on oral antibiotics for this.   DISCHARGE INSTRUCTIONS:  As follows:  Angela Bean is to follow up with me in approximately 3 to 5 days.  She has Bean JP drain in  her subQ tissue to prevent hematoma stroma formation which may interfere with wound healing.  She is to have JP drain emptied twice daily and output recorded.  She is also to undergo twice daily wet to dry's to the exposed areas of her wound.  She is also to continue by mouth Augmentin for 7 days with her urinary tract infection and for prophylaxis for wound.  She is to call or return to the ED if has increased pain, redness, drainage from incision.     ____________________________ Si Raiderhristopher Bean. Dre Gamino, MD cal:ea D: 06/04/2013 04:28:25 ET T: 06/04/2013 04:46:37 ET JOB#: [id number]  cc: Cristal Deerhristopher Bean. Izack Hoogland, MD, <Dictator> Jarvis NewcomerHRISTOPHER Bean Jaleia Hanke MD ELECTRONICALLY SIGNED 06/08/2013 8:29

## 2014-08-28 NOTE — Discharge Summary (Signed)
PATIENT NAME:  Angela Bean, Bryna A MR#:  [id number] DATE OF BIRTH:  November 17, 1923  DATE OF ADMISSION:  01/25/2014 DATE OF DISCHARGE:  01/27/2014    [facility]   DISCHARGE DIAGNOSES:  1.  Metabolic encephalopathy secondary to dehydration.  2.  Acute on chronic renal failure.  3.  Chronic kidney disease stage 3.  4.  Hypertension.  5.  Hyperlipidemia.  6.  Osteoarthritis.  7.  Hypothyroidism.  8.  History of extended spectrum beta-lactamase, finished 10 day course of IV Invanz.   CONSULTATIONS: ID consult with Clydie Braunavid Fitzgerald, MD and also urology consult, Trey Paulaichard Hart, DO   DISCHARGE MEDICATIONS:  1.  Enteric ASA 81 p.o. daily,  2.  Tylenol 325 mg 2 tablets every 6 hours as needed for pain.  3.  Lisinopril 1 capsule p.o. b.i.d.  4.  Tramadol 50 mg every 6 hours.  5.  Celexa 10 mg p.o. daily.  6.  Multivitamin 1 tablet daily.  7.  Albuterol ipratropium 1 unit of nebulizer 3 times a day as needed.  8.  Lidocaine to the affected area topical ointment 4 times daily.  9.  Ensure 1 can p.o. t.i.d. 10.  Advised to stop the lisinopril and also Invanz.   DIET: Low-sodium diet.   LABORATORY DATA:  There is a note from ID, Dr. Sampson GoonFitzgerald recommended repeating urine cultures if the patient gets symptoms, but that needs to be from an indwelling catheter.   HOSPITAL COURSE: This patient is an 81 year old female patient [facility] status.  Please look at history and physical for full details. The patient received Bactrim and also gentamicin and Invanz.  The patient received these antibiotics for urinary tract infection.  The patient's urine cultures showed ESBL on 01/06/2014 and she was given Bactrim from 09/04 through 01/12/2014 and the patient received a dose of IM gentamicin and then she developed a reaction to gentamicin, so gentamicin was changed to IV Invanz for 10 days and this was started on 01/15/2014.  The patient was noted to have  decreased p.o. intake since she was started on multiple antibiotics and admitted to the hospitalist service for acute on chronic renal failure, started on IV fluids. The patient continued on IV Invanz and she finished 10 day course here on 01/26/2014.  The patient's antibiotics were stopped as per ID recommendation. The patient did not take any antibiotics to go to rehab.  The patient nicely improved in terms of her renal failure.  The patient received IV fluids and mental status improved.  Her has creatinine was 2 and BUN was 28 on admission. The patient had no white count and the urine had trace bacteria.  As creatinine improved nicely with fluids with 1.65, and BUN was 22, so after discussing with ID and also urology, it was felt that the patient did need  any  antibiotics and was discharged back to [facility] as her kidney function improved with fluids.  We did start the ACE inhibitor to prevent further renal failure in the future and we have added Ensure to her dietary regimen.  The patient's condition and discharge plan has been discussed with her daughter.    DISCHARGE VITAL SIGNS:  Temperature 98.5, heart rate 69, blood pressure 156/60 and saturations 97% on 3 liters.  TIME SPENT ON DISCHARGE PREPARATION: More than 30 minutes.     ____________________________ Katha HammingSnehalatha Shanice Poznanski, MD sk:DT D: 01/28/2014 11:15:20 ET T: 01/28/2014 12:02:44 ET JOB#: [id number]  cc: Katha Hamming, MD, <Dictator> Katha Hamming MD ELECTRONICALLY SIGNED 02/03/2014 14:41

## 2014-08-28 NOTE — Op Note (Signed)
PATIENT NAME:  Angela Bean, [name] MR#:  [id number] DATE OF BIRTH:  [birth year]  DATE OF PROCEDURE:  06/04/2013  PREOPERATIVE DIAGNOSIS: Right leg laceration with degloving.  POSTOPERATIVE DIAGNOSIS: Right leg laceration with degloving.  PROCEDURE PERFORMED: Wound exploration, debridement and partial closure of right leg laceration with degloving, 12 x 13 cm total exposed area.  Laceration length 12 cm  ANESTHESIA: IV sedation.  ESTIMATED BLOOD LOSS: 50 mL.  COMPLICATIONS: None.   SPECIMENS: None.   INDICATIONS FOR PROCEDURE: Ms. Angela Bean is a pleasant 81 year old female who presented after a fall and a traumatic leg injury. It had significant undermining and was not completely hemostatic when I evaluated it in the ER. She was brought to the operating room for debridement of necrotic tissue and attempts at closure of wound.   DETAILS OF PROCEDURE: As follows: Informed consent was obtained. Ms. Angela Bean was brought to the operating room suite and given IV sedation. Her right leg was prepped and draped in standard surgical fashion. A timeout was then performed, correctly identifying the patient name, operative site and procedure to be performed. There was a large hematoma which was evacuated. In her subcutaneous tissue, the laceration appeared to go to the fascia on the lateral and posterior aspect of the wound. There was a small laceration posteriorly as well. After clot was evacuated, hemostasis was obtained. I had attempted to close in layers; however, due to the quality of her tissue, all sutures for a deep layer pulled through. I then instead proceeded to close the wound using vertical mattress 3-0 nylon sutures. At the most cranial aspect of the wound, that was able to come together; however, there was an anterior defect measuring approximately 3 x 2 cm where there was some previous skin loss, where this did not reach. More caudally, I was able to use vertical mattresses to approximate a good portion  of the wound; however, the most caudal aspect, the skin was obviously dead, and debrided and was not able to reach the most caudad aspect. Posteriorly, there was a small laceration which was closed with vertical mattress sutures. I did place a JP drain in the subcutaneous tissue to prevent seroma or hematoma collection, which would cause failing of wound to heal. In addition, at the areas where there was no skin coverage, I did anchor the skin to the underlying fascial tissue using 3-0 Vicryl sutures. There was also a small area of desquamation more cranially on the patient's lower leg, and the skin was removed. Bacitracin and Adaptic were used to cover this wound, while wet-to-dry was used to dress the areas which were not able to be covered on the main wound. Kerlix were then used to wrap around the leg to provide pressure to allow the thinner skin to hopefully adhere to the underlying fascia. The patient was then awoken and brought to postanesthesia care unit. There were no immediate complications. Needle, sponge and instrument counts were correct at the end of the procedure.   ____________________________ Si Raiderhristopher A. Taye Cato, MD cal:lb D: 06/04/2013 04:23:09 ET T: 06/04/2013 06:45:30 ET JOB#: [id number]  cc: Cristal Deerhristopher A. Carsin Randazzo, MD, <Dictator> Jarvis NewcomerHRISTOPHER A Everley Evora MD ELECTRONICALLY SIGNED 06/08/2013 8:30

## 2014-08-28 NOTE — Consult Note (Signed)
PATIENT NAME:  Angela Bean, Lashunta A MR#:  [id number] DATE OF BIRTH:  [birth year]  DATE OF CONSULTATION:  01/27/2014  REFERRING PHYSICIAN:   CONSULTING PHYSICIAN:  Caralyn Guileichard D. Edwyna ShellHart, DO  IMPRESSION: Previous urinary tract infection. Presently negative culture on admission. Previous infection was beta-[facility]ase Escherichia [facility]. The patient is a diabetic and has osteoarthritis, hypothyroidism, hypotension. She was admitted with altered mental status. Creatinine was up at 2.0. However, she has been treated appropriately at the nursing home with gentamicin and Bactrim. I do not have a differential.   PAST SURGICAL HISTORY: Her surgical history and scars indicate appendectomy, cholecystectomy, ORIF of right tibia, right ankle fracture, and C2 neck fracture.   SOCIAL HISTORY: At the present time she does not smoke or drink.  ALLERGIES: LEVAQUIN.   REVIEW OF SYSTEMS: Negative except for her HPI.   PHYSICAL EXAMINATION: GENERAL: She is stable now. No fever. Pleasant. Scars are present. She has got ecchymosis on her arms. Pupils are round, reactive to light. She is completely well.  NECK: Supple.  HEART: Sounds regular rate and rhythm. LUNGS: Some expiratory rales bilaterally.  EXTREMITIES: 1+ edema.  ABDOMEN: Soft, nondistended. Suprapubic area is nondistended.  ASSESSMENT AND PLAN: Since her urine culture is negative, and she denies recurrent urinary tract infections, and I do not think it is the cause of her altered mental status as she does not seem to have an altered mental status at the present time, she knows how old she is, she is alert and oriented, my recommendation is to continue her off antibiotics. If she starts having more than 1 or 2 infections in a year, I would put her on low-dose trimethoprim at 100 mg once a day for 3-4 months to suppress. I would make sure she stays hydrated, as her creatinine is up. She is dehydrated and this is a frequent cause in a  diabetic of infections. Her sugar intake should be decreased. She should only have 1800 calories, at most, per day. I would get a CAT scan of the abdomen and make sure she does not have renal calculus. I would also do a catheter specimen the next time she is here, and I would do a postvoid residual, also, which would indicate retention, which I do not feel she has at the present time.  Thank you very much for this consultation.    ____________________________ Caralyn Guileichard D. Edwyna ShellHart, DO rdh:ST D: 01/27/2014 01:01:59 ET T: 01/27/2014 01:32:07 ET JOB#: [id number]  cc: Caralyn Guileichard D. Edwyna ShellHart, DO, <Dictator> Chesley Valls D Kresta Templeman DO ELECTRONICALLY SIGNED 01/29/2014 15:38

## 2014-08-28 NOTE — H&P (Signed)
   Subjective/Chief Complaint Right leg laceration following fall   History of Present Illness Angela Bean is a pleasant 81 yo F with a history of multiple orthopedic procedures who presents with a large right leg soft tissue laceration following a fall.  She is here with her daughter.  According to her daughter, she was attempting to transfer herself (which she should not be doing) and is thought to have hit her leg on the corner of her wheelchair.  Was bleeding quite extensively on arrival but has subsided.  Tib/fib xray shows soft tissue defect, no obvious foreign body or fracture.  Some discomfort but not horrible.  Otherwise was doing well prior to this.  There was 1 area of active bleeding on my examination which I had controlled with a single 4-0 vicryl.   Past History H/o ORIF right tibia H/o right ankle fracture H/o right knee surgery S/p appendectomy S/p cholecystectomy DM Hypothyroidism Osteoarthritis Hypercholesterolemia HTN Cataract surgery H/o AAA   Past Medical Health Hypertension, Diabetes Mellitus   Past Med/Surgical Hx:  neck fracture C2:   osteoarthritis:   Diabetes Mellitus, Type II (NIDD): diet controlled  Hypothyroidism:   Hypercholesterolemia:   HTN:   I and D ORIF right tibia:   Ankle Fracture - Right : Hardware applied: 2007  cataract surgery: implants in left eye  Appendectomy:   Cholecystectomy:   amputation: left 2nd toe  Knee Surgery - Right: revision  Joint Replacement: right knee replacement x 2  ALLERGIES:  NKDA: None  Family and Social History:  Family History Non-Contributory   Social History negative tobacco, negative ETOH   Review of Systems:  Subjective/Chief Complaint Right leg laceration, complex   Fever/Chills No   Cough No   Sputum No   Abdominal Pain No   Diarrhea No   Constipation No   Nausea/Vomiting No   SOB/DOE No   Chest Pain No   Dysuria No   Tolerating Diet Yes   Physical Exam:  GEN well developed,  no acute distress   HEENT pink conjunctivae, PERRL, hearing intact to voice   RESP normal resp effort  clear BS  no use of accessory muscles   CARD regular rate  no murmur   ABD denies tenderness  soft  normal BS   LYMPH negative neck, negative axillae   EXTR Approx 7 x 5 cm laceration right lateral leg, No pain with right foot dorsi/plantarflexion   SKIN No rashes, No ulcers   NEURO cranial nerves intact, negative rigidity, negative tremor, motor/sensory function intact   PSYCH alert, Interactive    Assessment/Admission Diagnosis Angela Bean is a pleasant 81 yo F with large right lower extremity traumatic soft tissue laceration.  Appears to be some tissue loss.  Hemostasis not completely certain.   Plan To OR for debridment, washout and complex closure of right leg laceration.   Electronic Signatures: Jarvis NewcomerLundquist, Cem Kosman A (MD)  (Signed 28-Jan-15 22:57)  Authored: CHIEF COMPLAINT and HISTORY, PAST MEDICAL/SURGIAL HISTORY, ALLERGIES, FAMILY AND SOCIAL HISTORY, REVIEW OF SYSTEMS, PHYSICAL EXAM, ASSESSMENT AND PLAN   Last Updated: 28-Jan-15 22:57 by Jarvis NewcomerLundquist, Jamerion Cabello A (MD)

## 2014-08-28 NOTE — Discharge Summary (Signed)
PATIENT NAME:  Angela Bean, Angela Bean MR#:  [id number] DATE OF BIRTH:  Sep 25, 1923  DATE OF ADMISSION:  06/06/2013 DATE OF DISCHARGE:  06/09/2013   Addendum   Addendum to the  patient's previous discharge summary. Since January 29th, Angela Bean was found to have an ESBL E. coli urinary tract infection and she was transitioned from Augmentin to Invanz and later began on Bactrim as well. Her leg has not quite demarcated and appears to be without erythema or induration. She will be discharged on 7 more days of p.o. Bactrim. Pincus Sanesnvanz will be stopped at the time of discharge. In my estimation, she was tolerating good p.o. with minimal pain. Her creatinine was slightly elevated over baseline, and before discharge, will obtain Bean followup creatinine to ensure it has not worsened. I have talked with her son-in-law, Angela Bean, who is Bean community primary care doctor, and he agrees with this plan.   ____________________________ Si Raiderhristopher Bean. Geoffry Bannister, MD cal:lb D: 06/09/2013 12:57:00 ET T: 06/09/2013 13:10:14 ET JOB#: [id number]  cc: Cristal Deerhristopher Bean. Earnie Rockhold, MD, <Dictator> Jarvis NewcomerHRISTOPHER Bean Tyiana Hill MD ELECTRONICALLY SIGNED 06/12/2013 19:08

## 2014-08-28 NOTE — Consult Note (Signed)
PATIENT NAME:  Angela Bean, Angela Bean MR#:  [id number] DATE OF BIRTH:  [birth year]  DATE OF CONSULTATION:  01/26/2014  REFERRING PHYSICIAN:   CONSULTING PHYSICIAN:  Stann Mainlandavid P. Sampson GoonFitzgerald, MD  REQUESTING PHYSICIAN:  Dr. Sherryll BurgerShah.    REASON FOR CONSULTATION:  Urinary tract infection, altered mental status, ESBL extended spectrum beta-lactamase Escherichia coli.     HISTORY OF PRESENT ILLNESS: This is Bean very pleasant 81 year old female who [facility]. She has Bean history of diabetes, osteoarthritis, hypothyroidism, and hypotension. She was admitted September 21 with altered mental status and lethargy and decreased p.o. intake. Of note the patient has been on antibiotics for the last 16 days or so for urinary tract infection. Her cultures are sent over with her. She has grown an ESBL Escherichia on September 2.  She was initially treated with Bactrim from September 4 to September 8 and then changed to intramuscular gentamicin q. 8 but had Bean reaction on September 11. At that time she was started on Invanz 1 gram IM daily x 10 days.   Apparently she has had Bean somewhat downward course and not eating much and is now immobile due to multiple issues with her lower extremities.   PAST MEDICAL HISTORY:  1. Hypertension.  2. Hyperlipidemia.  3. Osteoarthritis, bedbound status.  4. Hypothyroidism.  5. Constipation.   PAST SURGICAL HISTORY: ORIF right tibia, ankle fracture right tibia, cataract surgery, appendectomy, cholecystectomy, C2 neck fracture surgery.   SOCIAL HISTORY: [facility]. Does not smoke, does not drink. Her son-in-law is Dr. Juanetta GoslingHawkins.   FAMILY HISTORY: Noncontributory.   ALLERGIES:  LEVAQUIN.   ANTIBIOTICS SINCE ADMISSION: Include ertapenem, prior antibiotics as per HPI.    REVIEW OF SYSTEMS: 11 systems reviewed and negative except as per HPI.    PHYSICAL EXAMINATION:  VITAL SIGNS: Temperature 97.5, pulse 73, blood pressure 138/74, respirations 22, saturation 93%  on 3 liters.  GENERAL: She is pleasant, interactive, in no acute distress. She is sitting up in bed. She is able to converse, but has somewhat slowed mentation, seems to be hard of hearing.  HEENT: Pupils equal, round, reactive to light and accommodation.  OROPHARYNX: Mucous membranes dry. No thrush.  NECK: Supple.  HEART: Regular.  LUNGS: Coarse breath sounds bilaterally with some crackles at the bases.  ABDOMEN: Soft, nontender, nondistended.  EXTREMITIES: She has 1+ edema bilateral lower extremities. She has deformities of her bilateral ankles.  NEUROLOGIC:  She is awake and interactive, able to move all 4 extremities.   DATA: CT of the head September 21, chronic white matter changes, no acute abnormalities. Chest x-ray September 21, emphysema with scattered pulmonary parenchymal scarring and chronic blunting of the left costophrenic angle. Humerus x-ray in the left is negative. White blood count on admission was 10.0, hemoglobin 12.7, platelets 231,000.  Urinalysis had 250 white cells, 26 red cells, white blood cell clumps were present. Urine culture from an in and out catheter September 21 was negative, however urine culture from September 2 had greater than 100,000 Escherichia coli, ESBL producer sensitive to amikacin, Unasyn, cefoxitin, ertapenem, gentamicin, meropenem, tobramycin. Of note when it is an ESBL producer it should not be sensitive to the Unasyn. Renal function shows Bean creatinine of 1.65, down from 2.06 on September 21. LFTs are normal except albumin slightly low at 3.2.   IMPRESSION: An 81 year old female in declining health, who [facility], admitted with altered mental status, lethargy, and dehydration. Her urinalysis remains positive  and she had recently grown an extended spectrum beta-lactamase Escherichia coli in her urine. She however is finishing Bean 10 day course of this antibiotic starting September 12. Urine culture so far is negative.   She tells me she  does not have recurrent urinary infections. However urinalyses were done in June of 2015 and that showed moderate leukocyte esterase. I do not have any culture results from that time.   I do not think her urinary infection is significant enough to be causing the altered mental status at this point. It does seem like she has had Bean general decline with decreased p.o. intake.   RECOMMENDATIONS:  1. Continue her off antibiotics.  2. Monitor for increased white blood count or fever.  3. If she worsens I would repeat Bean urine culture and Bean urinalysis with in and out catheter to ensure not contaminated.  4. If she does continue to have pyuria I would suggest further evaluation of her urinary system with possible ultrasound or CT scanning.  5. Thank you for the consult. I will be glad to follow with you.     ____________________________ Stann Mainland. Sampson Goon, MD dpf:bu D: 01/26/2014 16:20:54 ET T: 01/26/2014 18:03:19 ET JOB#: [id number]  cc: Stann Mainland. Sampson Goon, MD, <Dictator> Tomoya Ringwald Sampson Goon MD ELECTRONICALLY SIGNED 02/07/2014 23:54

## 2014-08-28 NOTE — H&P (Signed)
PATIENT NAME:  Angela Bean, Angela Bean MR#:  [id number] DATE OF BIRTH:  06/15/1923  DATE OF ADMISSION:  01/25/2014  PRIMARY CARE PHYSICIAN:  [facility].   CHIEF COMPLAINT: Altered mental status.   HISTORY OF PRESENT ILLNESS: History is obtained from the patient's daughter who is present in the Emergency Room. The patient has altered mental status, very lethargic, unable to give any history or review of systems. Angela Bean is an 81 year old Caucasian female with past medical history of osteoarthritis, type 2 diabetes, hypothyroidism, and hypertension, [facility] after she started having acute on chronic onset confusion with poor p.o. intake per daughter. The patient has had 3 rounds of antibiotics. On 01/08/2014 Bactrim was started for 10 days.  It was changed to gentamicin IM for 5 days given her urine cultures. The patient had some reaction to it, hence, gentamicin was changed to IV Invanz 1 gram IM for 10 days. Today is her last day of IV Invanz. She came to the Emergency Room, was found to be clinically dehydrated. Her creatinine baseline is around 1.6. She came in with creatinine of 2.06. The patient was able to open her eyes, responded some to verbal commands; however, was quite lethargic. She is being admitted for acute on chronic renal failure. Her white count is normal and her urine does show some WBCs; however, she does not appear to be febrile from that. She is [facility]. She is being admitted for dehydration.   PAST MEDICAL HISTORY: 1.  Hypertension.  2.  Hyperlipidemia.  3.  Osteoarthritis. [facility].  4.  Hypothyroidism.  5.  ORIF, right tibia.  6.  Ankle fracture, right tibia.  7.  Cataract surgery.  8.  Appendectomy.  9.  Cholecystectomy.  10.  C2 neck fracture.  11.  History of constipation.    ALLERGIES: LEVAQUIN.   SOCIAL HISTORY: The patient [facility] Bean [facility].  She is Bean nonsmoker, nonalcoholic.   FAMILY HISTORY: Mother had Bean history of Hodgkin's lymphoma and diabetes. Dad had Bean history of pancreatic cancer and deceased with pancreatic cancer. This is obtained from old records.   MEDICATIONS: 1.  Acidophilus, oral capsule 1 capsule b.i.d.  2.  DuoNeb 3 mL t.i.d.  3.  Citalopram 10 mg daily.  4.  Invanz 1 gram IM for 10 days. Last dose is 01/25/2014.   5.  Ketoconazole topical 2%, apply to affected area on the groins as needed.  6.  Lidocaine topical ointment 5%, apply to affected area p.r.n.  7.  Lisinopril 5 mg p.o. daily.  8.  Milk of magnesia 30 mL b.i.d. as needed.  9.  Multivitamin with minerals p.o. daily.  10.  Robitussin 20 mL orally 4 times Bean day as needed.  11.  Synthroid 25 mcg p.o. daily.  12.  Tramadol 50 mg 1 tablet every 6 hours as needed.  13.  Tylenol 325 mg 2 tablets every 6 hours as needed.   REVIEW OF SYSTEMS: Unobtainable. The patient is very lethargic.   PHYSICAL EXAMINATION: GENERAL: The patient is quite lethargic.  VITAL SIGNS:  She is afebrile. Pulse is 69, blood pressure is 127/74, saturations are 97% on 4 liters.  HEENT: Atraumatic, normocephalic. PERRLA, EOM intact. Oral mucosa is dry. Tongue is coated.  NECK: Supple. No JVD. No carotid bruit.  RESPIRATORY: Decreased breath sounds at the bases bilaterally. No rales, rhonchi,  respiratory distress, or labored breathing.  CARDIOVASCULAR: Both the heart sounds are normal. Rate, rhythm regular. PMI not lateralized. Chest nontender.  EXTREMITIES: Good pedal pulses, good femoral pulses. No lower extremity edema. The patient does have some contractures of the lower extremities since she is bedbound. She has Bean bruise over her right knee.  ABDOMEN: Soft, benign, nontender. No organomegaly.  NEUROLOGIC: The patient is lethargic. She moves her extremities spontaneously. No focal neurologic deficit noted so far. Very limited exam.  SKIN: Warm and dry. She does have Bean bruise over  on the right knee.   LABORATORY DATA: Urinalysis: WBCs 250, 3+ leukocyte esterase. RBC is 26. White count is 10.0, hemoglobin and hematocrit is 12.7 and 39.7, platelet count is 231,000.  Glucose is 78, BUN is 28, creatinine is 2.06. Sodium is 143, potassium is 5, chloride 111, bicarbonate is 26, SGOT is 29, SGPT is 12, albumin is 3.2. Troponin is 0.02.   Left humerus, no acute abnormality.   ASSESSMENT: Angela Bean is an 81 year old with multiple medical problems, comes in with increasing lethargy, confusion. She is being admitted with:  1.  Altered mental status, acute encephalopathy, likely secondary to acute on chronic renal failure with poor p.o. intake and dehydration. The patient's creatinine baseline is 1.6, came in with creatinine of 2.0 with clinical symptoms of dehydration. Will admit for intravenous aggressive hydration. Once patient is awake enough, can start her on regular diet.  We will monitor ins and outs, avoid nephrotoxins, and monitorBMP 2.  Recurrent urinary tract infections. The patient had Bean third round of antibiotics. She had Bactrim, gentamicin, and now on Invanz, finishing up the course of Invanz today, Bean 10 day course. Will send urine for culture and sensitivity. White count is stable. No fever. I am not going to be changing any of the antibiotics, neither does she need any new antibiotics until her urine culture results are obtained. She does have Bean history of multidrug-resistant urinary tract infection.  3.  Hypothyroidism. Continue Synthroid.  4.  Hypertension. Continue lisinopril.  5.  History of dementia, on citalopram.  6.  Deep vein thrombosis prophylaxis. Subcutaneous heparin t.i.d.   The above was discussed with the patient's daughter, who is present in the Emergency Room. The patient is Bean no code, DO NOT RESUSCITATE.   Care management and social worker for discharge planning. We will not order physical therapy since the patient is bedbound, wheelchair[facility]bound  at [facility].    TIME SPENT: Fifty minutes.   ____________________________ Wylie Hail Allena Katz, MD sap:LT D: 01/25/2014 19:13:26 ET T: 01/25/2014 20:37:22 ET JOB#: [id number]  cc: Emori Kamau Bean. Allena Katz, MD, <Dictator> Willow Ora MD ELECTRONICALLY SIGNED 01/28/2014 15:22

## 2014-08-28 NOTE — Discharge Summary (Signed)
PATIENT NAME:  Angela Bean, Angela Bean MR#:  [id number] DATE OF BIRTH:  10-03-1923  DATE OF ADMISSION:  06/06/2013 DATE OF DISCHARGE:  06/11/2013  PRIMARY CARE PHYSICIAN: Eloise HarmanMatthew Barabas.  ADMITTING PHYSICIAN: Dr. Ida Roguehristopher Lundquist.   Addendum   The last 48 hours, she has been treated with Bactrim for her resistant urinary tract infection and is continuing on her standard wound care to date.  Dr. Juliann PulseLundquist dictated Bean discharge summary on the 29th and again on the 3rd, both of which are available for discharge. She has had slight elevation in her creatinine, which will be followed as an outpatient by her primary care doctor, Dr. Claudius SisJim Hawkins, who has been contacted by Dr. Juliann PulseLundquist. Discharge medications have been outlined in her original discharge summary dated January 29th.  They include Synthroid 25 mcg p.o. daily, aspirin 81 mg p.o. daily, lisinopril 5 mg p.o. daily, prednisone 10 mg p.o. daily, Celexa 10 mg p.o. daily, acidophilus 1 cap twice Bean day, milk of magnesia 30 mL b.i.d. p.r.n., lisinopril 10 mg p.o. daily, tramadol 50 mg p.o. q.6 hours, Bactrim DS 1 tab b.i.d.   FINAL DISCHARGE DIAGNOSES: Right leg laceration,  resistant urinary tract infection.    ____________________________ Quentin Orealph L. Ely III, MD rle:dmm D: 06/11/2013 11:45:58 ET T: 06/11/2013 11:52:58 ET JOB#: [id number]  cc: Quentin Orealph L. Ely III, MD, <Dictator> Quentin OreALPH L ELY MD ELECTRONICALLY SIGNED 06/15/2013 22:56

## 2014-08-29 NOTE — H&P (Signed)
PATIENT NAME:  Angela Bean, Angela Bean MR#:  [id number] DATE OF BIRTH:  1923-05-14  DATE OF ADMISSION:  08/19/2011  REFERRING PHYSICIAN: Dr. Lorenso Courier FAMILY PHYSICIAN: Dr. Randa Lynn  REASON FOR ADMISSION: Intractable nausea and vomiting with weakness and near syncope.   HISTORY OF PRESENT ILLNESS: The patient is an 81 year old female who lives alone with a significant history of benign hypertension, hypothyroidism, diabetes, and hyperlipidemia. Has had previous right leg trauma with ankle, knee, and tibia surgeries in the past. Developed intractable nausea, vomiting and weakness today. Had a near syncopal episode where she fell to the ground but did not lose consciousness. She was brought to the Emergency Room where she was found to have a urinary tract infection and was hypokalemic. She was hyperglycemic as well. Her white count was 18.9 thousand. She is now admitted for further evaluation.   PAST MEDICAL HISTORY:  1. Osteoarthritis.  2. Type 2 diabetes mellitus.  3. Hypothyroidism.  4. Benign hypertension.  5. Hyperlipidemia.  6. Status post right ankle surgery.  7. Status post right knee surgery.  8. History of right tibia fracture.  9. Status post appendectomy.  10. Status post cholecystectomy.   MEDICATIONS ON ADMISSION: None.   ALLERGIES: No known drug allergies.   SOCIAL HISTORY: The patient has a remote history of tobacco abuse but none recently. No history of alcohol abuse. She lives alone.   FAMILY HISTORY: Pressure or diabetes, coronary artery disease, and hypertension.    REVIEW OF SYSTEMS: CONSTITUTIONAL: No fever or change in weight. EYES: No blurred or double vision. No glaucoma. ENT: No tinnitus or hearing loss. No nasal discharge or bleeding. No difficulty swallowing. RESPIRATORY: No cough or wheezing. Denies hemoptysis. CARDIOVASCULAR: No chest pain or orthopnea. No palpitations. GASTROINTESTINAL: Nausea, vomiting but no diarrhea. No abdominal pain. No change in bowel habits.  GENITOURINARY: No dysuria or hematuria. No incontinence. ENDOCRINE: No polyuria or polydipsia. No heat or cold intolerance. HEMATOLOGIC: Patient denies anemia, easy bruising, or bleeding. LYMPHATIC: No swollen glands. MUSCULOSKELETAL: Patient does have pain in her back and legs. Denies neck, shoulder, or hip pain. No gout. NEUROLOGIC: No numbness or migraines. Denies stroke or seizures. PSYCH: Patient denies anxiety, insomnia, or depression.   PHYSICAL EXAMINATION: GENERAL: Patient is elderly, in no acute distress.   VITAL SIGNS: Vital signs are currently remarkable for a blood pressure of 154/72 with a heart rate of 65 and a respiratory rate of 30. She is afebrile.   HEENT: Normocephalic, atraumatic. Pupils equally round and reactive to light and accommodation. Extraocular movements are intact. Sclerae are not icteric. Conjunctivae are clear. Oropharynx is dry but clear.   NECK: Supple without jugular venous distention. No adenopathy or thyromegaly is noted.   LUNGS: Lungs reveal basilar rhonchi. No wheezes or rales. No dullness. Respiratory effort is normal.   CARDIAC: Regular rate and rhythm with normal S1, S2. No significant rubs or gallops are present. There is a 2/6 systolic murmur noted.   ABDOMEN: Soft, nontender with normoactive bowel sounds. No organomegaly or masses were appreciated. No hernias or bruits were noted.   EXTREMITIES: Without clubbing, cyanosis, edema. Pulses were 1+ bilaterally.   SKIN: Warm and dry without rash or lesions.   NEUROLOGIC: Cranial nerves II through XII grossly intact. Deep tendon reflexes were symmetric. Motor and sensory examination is nonfocal.   PSYCH: Exam revealed a patient who is alert and oriented to person, place, and time. She was cooperative and used good judgment.   LABORATORY, DIAGNOSTIC AND RADIOLOGICAL DATA: Urinalysis revealed  42 WBCs per high-power field with trace bacteria and trace leukocyte esterase. White count was 18.9 with a  hemoglobin of 12.9. Glucose 211 with a BUN of 12 and a creatinine of 1.04 with a sodium of 141 and a potassium of 3.1. Head CT showed no acute abnormalities.   ASSESSMENT:  1. Near syncope.  2. Hypokalemia.  3. Type 2 diabetes with hyperglycemia.  4. Urinary tract infection.  5. History of hypothyroidism.  6. Benign hypertension, stable.   PLAN: Patient will be admitted to the floor and started on IV fluids with potassium supplementation. Will send blood and urine cultures and begin IV antibiotics. Will consult physical therapy as well as care management team for disposition. Will check her thyroid status with a TSH. Follow sugars with Accu-Cheks before meals and at bedtime and add sliding scale insulin as needed. Follow up routine labs in the morning. Further treatment and evaluation will depend upon the patient's progress.   TOTAL TIME SPENT ON THIS PATIENT: 50 minutes.   ____________________________ Duane LopeJeffrey D. Judithann SheenSparks, MD jds:cms D: 08/19/2011 05:57:33 ET T: 08/19/2011 09:03:07 ET JOB#: [id number]  cc: Duane LopeJeffrey D. Judithann SheenSparks, MD, <Dictator> Reola MosherAndrew S. Randa LynnLamb, MD Asuna Peth Rodena Medin Fillmore Bynum MD ELECTRONICALLY SIGNED 08/21/2011 15:06

## 2014-08-29 NOTE — Consult Note (Signed)
PATIENT NAME:  Angela Bean, Angela Bean MR#:  [id number] DATE OF BIRTH:  December 20, 1923  DATE OF CONSULTATION:  08/20/2011  REFERRING PHYSICIAN:  Dr. Imogene Burn  CONSULTING PHYSICIAN:  Rosalyn Gess. Bacilio Abascal, MD  REASON FOR CONSULTATION: Leukocytosis.   HISTORY OF PRESENT ILLNESS: The patient is an 81 year old white female with a past history significant for diabetes and hypothyroidism who was admitted on 04/14 with nausea, vomiting, and near syncope. The patient states that she was in her usual state of health until she developed nausea and vomiting on the day of admission. She stood up and had a near-syncopal event. She usually is nonambulatory and mainly just transfers from her chair to a wheelchair and gets around her house that way. She has been nonambulatory for years. She was admitted to the hospital and started on Zosyn and vancomycin. Urine and blood cultures have been unremarkable. Her white count on admission, however, was 18.9 and today came down to 12.3. She has not had any documented fevers and did not have any fevers, chills, or sweats at home. She denies any cough or shortness of breath. She has not had any change in her bowels. Her nausea and vomiting are better since being in the hospital. She does have some swelling and discomfort in the right lower extremity.   ALLERGIES: None.   PAST MEDICAL HISTORY:  1. Diabetes.  2. Hypothyroidism.  3. Osteoarthritis.  4. Hypertension.  5. Hypercholesterolemia.  6. Status post right arthroplasty of the knee complicated by infection requiring removal and replacement.  7. Right tibial fracture status post surgery. She remains nonambulatory following that.    SOCIAL HISTORY: The patient lives alone. She is wheelchair-bound. She has a prior history of smoking but quit many years ago. She does not drink alcohol. She has no pets at home.   FAMILY HISTORY: Positive for diabetes, coronary artery disease, and hypertension.   REVIEW OF SYSTEMS: GENERAL: No fevers,  chills, or sweats. No malaise. HEENT: No headaches. No sinus congestion. No sore throat. NECK: No stiffness. No swollen glands. RESPIRATORY: No cough or shortness of breath. No sputum production. CARDIAC: No chest pains or palpitations. GI:  Positive nausea and vomiting on the day of admission. No change in her bowels.  GU: She states been urinating more since being in the hospital and being on IV fluids but prior to admission had not been having any dysuria or any increased urination. MUSCULOSKELETAL: No joint or muscle pain. NEUROLOGIC: She had near syncope prior to admission but did not lose consciousness. SKIN: She has some redness and swelling of the right lower extremity and this is new for her. NEUROLOGIC: She is nonambulatory due to her prior lower leg fracture. No focal weakness. PSYCHIATRIC: No complaints. All other systems are negative.   PHYSICAL EXAMINATION:  VITAL SIGNS: T-max 98.9, T-current 98.9, pulse 74, blood pressure 161/81, 90% on room air.   GENERAL: 81 year old white female in no acute distress.   HEENT: Normocephalic, atraumatic. Pupils equal and reactive to light. Extraocular motion intact. Sclerae, conjunctivae, and lids without evidence for emboli or petechiae. Oropharynx shows no erythema or exudate. Gums are in fair condition.   NECK: Supple. Full range of motion. Midline trachea. No lymphadenopathy. No thyromegaly.   LUNGS: Clear to auscultation bilaterally with good air movement. No focal consolidation.   HEART: Regular rate and rhythm without murmur, rub, or gallop.   ABDOMEN: Soft, nontender, and nondistended. No hepatosplenomegaly. No hernia is noted. No CVA tenderness.   EXTREMITIES: No evidence for tenosynovitis.  She had deformity of the right foot with several scars present. There was edema of the right lower extremity but not of the left.    SKIN: The right foot was swollen and erythematous although there was not a sharp demarcation. The skin was warm to touch  and was mildly tender. There was no evidence for lymphangitic streaking. There were no other rashes present. There were no stigmata of endocarditis, specifically no Janeway lesions or Osler nodes.   NEUROLOGIC: The patient was awake and interactive. She was moving her upper extremities. She had limited motion of the lower extremities.   PSYCHIATRIC: Mood and affect appeared normal.   LABORATORY, DIAGNOSTIC, AND RADIOLOGICAL DATA: BUN 10, creatinine 1.01, bicarbonate 24, anion gap of 9. LFTs were unremarkable. White count 12.3 with a hemoglobin 11.7, platelet count 215, ANC of 9.6. White count on admission was 18.9. Blood cultures from admission show no growth. A urinalysis on admission had 1+ blood, 100 mg/dL of protein, negative nitrites, trace leukocyte esterase, 8 red cells, and 42 white cells. A urine culture is growing 25,000 CFUs per milliliter of Proteus species. A pelvic x-ray shows no acute abnormalities. Chest x-ray showed some interstitial findings consistent with possible pulmonary fibrosis. A CT scan of the head without contrast showed some chronic changes but no acute abnormalities. An ultrasound of the bilateral Dopplers showed no hemodynamically significant stenosis.   IMPRESSION: 81 year old female with a history of diabetes and hypothyroidism who was admitted with nausea, vomiting, and near syncope who has leukocytosis.   RECOMMENDATIONS:  1. She does not appear to have a urinary tract infection. Her urinalysis had negative nitrites and trace leukocyte esterase, and the culture had less than 100,000 CFUs per milliliter of bacteria. Her right lower extremity is warm and erythematous. This could be cellulitis.  2. We will change her therapy to focus on cellulitis. We will change her antibiotics to cefazolin.  3. We will await the blood cultures.  4. Follow up CBC.  5. If she continues to improve, we will change her to Keflex.   This is a moderately complex infectious disease case.  Thank you very much for involving me in Ms. Tischer's care.  ____________________________ Rosalyn GessMichael E. Miyu Fenderson, MD meb:bjt D: 08/20/2011 17:34:20 ET T: 08/21/2011 09:21:33 ET JOB#: [id number]  cc: Rosalyn GessMichael E. Adell Panek, MD, <Dictator> Blasa Raisch E Remer Couse MD ELECTRONICALLY SIGNED 08/22/2011 17:27

## 2014-08-29 NOTE — Consult Note (Signed)
Impression: 81yo WF w/ h/o DM, hypothyroidism admitted with N/V and near syncope who has leukocytosis.    She does not appear to have a UTI.  Her u/a had negative nitrites and trace LE and the culture had <100k CFU/ml of bacteria.  Her right LE is warm and erythematous.  This could be cellulitis. Would change her therapy to focus on cellulitis.  Will change antibiotics to cefazolin. Await BCx. Follow CBC. If she continues to improve, will change to keflex.  Electronic Signatures: Margart Zemanek, Rosalyn GessMichael E (MD)  (Signed on 15-Apr-13 17:25)  Authored  Last Updated: 15-Apr-13 17:25 by Hassaan Crite, Rosalyn GessMichael E (MD)

## 2014-08-29 NOTE — Discharge Summary (Signed)
PATIENT NAME:  Angela Bean, Angela Bean MR#:  [id number] DATE OF BIRTH:  07/30/1923  DATE OF ADMISSION:  08/19/2011 DATE OF DISCHARGE:  08/24/2011   DIAGNOSES: 1. Bilateral leg cellulitis.  2. Leukocytosis.  3. Dehydration.  4. Hypertension.  5. Diabetes.  6. Hypothyroidism.   CONDITION: Stable.   MEDICATIONS:  1. Keflex 500 mg p.o. t.i.d. for 10 days.  2. Synthroid 0.025 mg p.o. daily.  3. Aspirin 81 mg p.o. daily.  4. Lisinopril 5 mg p.o. daily.   DIET: Low sodium 1800 calorie diet.   ACTIVITY: As tolerated.   FOLLOW-UP: Follow-up with primary care physician, Dr. Randa Bean, within 1 to 2 weeks.   REASON FOR ADMISSION: Intractable nausea and vomiting with weakness and near syncope.  PCP: Dr. Payton Bean    HOSPITAL COURSE: The patient is an 81 year old Caucasian female with Bean history of hypertension, diabetes, osteoarthritis, arthritis, hyperlipidemia, and hypothyroidism who was sent to the ED due to intractable nausea and vomiting with weakness and near syncope. For detailed history and physical examination, please refer to the admission note dictated by Angela Bean.  1. In the ED, the patient was found to have Bean white count of 18.9. Urinalysis showed urinary tract infection. The patient also had hypokalemia. The patient was admitted for further evaluation and treatment. At admission the patient was treated with IV fluid support and potassium supplement. Blood culture was sent which was negative. In addition, the patient has been treated with antibiotics, Zosyn. Urine culture showed Proteus mirabilis but according to Dr. Leavy Bean the patient has no urinary tract infection.  2. Cellulitis. The patient was noted to have bilateral lower part of leg tenderness and erythema and diagnosed with bilateral leg cellulitis. Dr. Leavy Bean changed the IV Zosyn to cefazolin. Since the patient had bilateral leg tenderness, we checked Bean D-dimer which was 1.61, elevated, so the patient was suspected to have DVT, however,  venous duplex of bilateral legs showed no DVT. The patient's symptoms improved. She has no complaints.   VITAL SIGNS TODAY: Temperature 97.8, blood pressure 145/84, pulse 83, oxygen saturation 94% on room air.   PHYSICAL EXAMINATION: GENERAL: The patient is alert, awake, oriented in no acute distress. CARDIOVASCULAR: S1, S2 regular rate and rhythm. No murmurs or gallops. PULMONARY: Bilateral air entry. No wheezing or rales. ABDOMEN: Soft. No distention or tenderness. Bowel sounds present. EXTREMITIES: No edema, clubbing, or cyanosis. Erythema decreased. No tenderness.   In addition, the patient's white count decreased to 11.5. The patient is clinically stable. She will be discharged to SNF. Discussed the patient's discharge plan with case manager and the patient.   TIME SPENT: About 45 minutes.  ____________________________ Angela PollackQing Pascuala Klutts, MD qc:drc D: 08/23/2011 16:00:23 ET T: 08/24/2011 09:58:20 ET JOB#: [id number]  cc: Angela PollackQing Locklan Canoy, MD, <Dictator> Angela MosherAndrew S. Angela LynnLamb, MD Angela PollackQING Jakira Mcfadden MD ELECTRONICALLY SIGNED 08/28/2011 6:24

## 2014-10-30 ENCOUNTER — Other Ambulatory Visit
Admission: RE | Admit: 2014-10-30 | Discharge: 2014-10-30 | Disposition: A | Discharge status: Home / Self Care | Payer: Medicare Other | Source: Ambulatory Visit | Attending: Family Medicine | Admitting: Family Medicine

## 2014-10-30 DIAGNOSIS — R4182 Altered mental status, unspecified: Secondary | ICD-10-CM | POA: Diagnosis present

## 2014-10-30 LAB — URINALYSIS COMPLETE WITH MICROSCOPIC (ARMC ONLY)
BACTERIA UA: NONE SEEN
Bilirubin Urine: NEGATIVE
GLUCOSE, UA: NEGATIVE mg/dL
KETONES UR: NEGATIVE mg/dL
Nitrite: NEGATIVE
PROTEIN: 100 mg/dL — AB
SPECIFIC GRAVITY, URINE: 1.018 (ref 1.005–1.030)
pH: 5 (ref 5.0–8.0)

## 2014-11-01 LAB — URINE CULTURE

## 2015-04-26 IMAGING — CR DG KNEE COMPLETE 4+V*L*
1 series · 4 of 4 positions shown · non-contrast
Comparison: 02/04/2012

CLINICAL DATA: Left knee pain after fall

EXAM:
LEFT KNEE - COMPLETE 4+ VIEW

[Series 1: ap · 0.17mm/px · 4 of 4 slices shown]
[im 1/4]
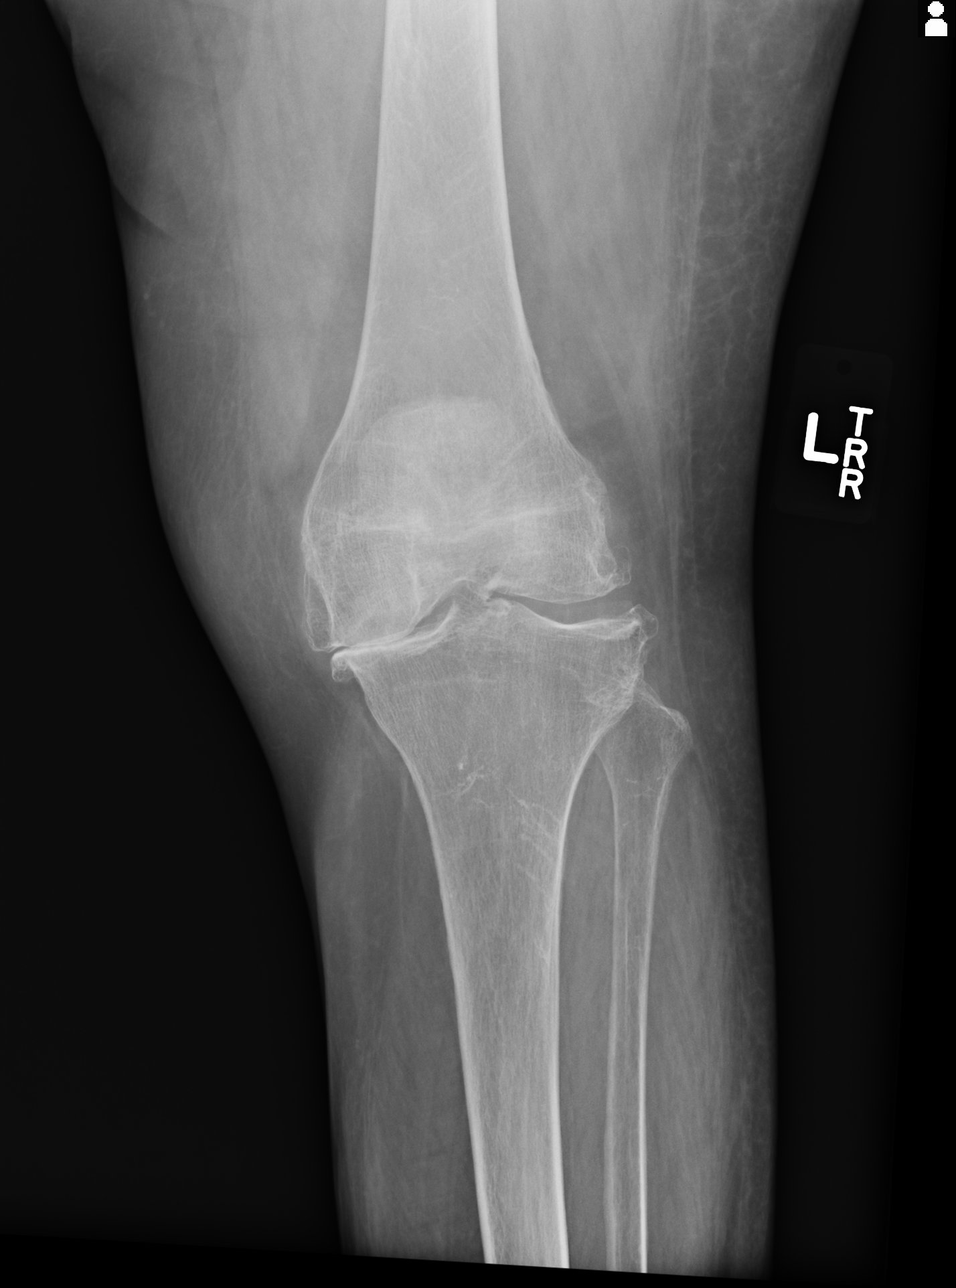
[im 2/4]
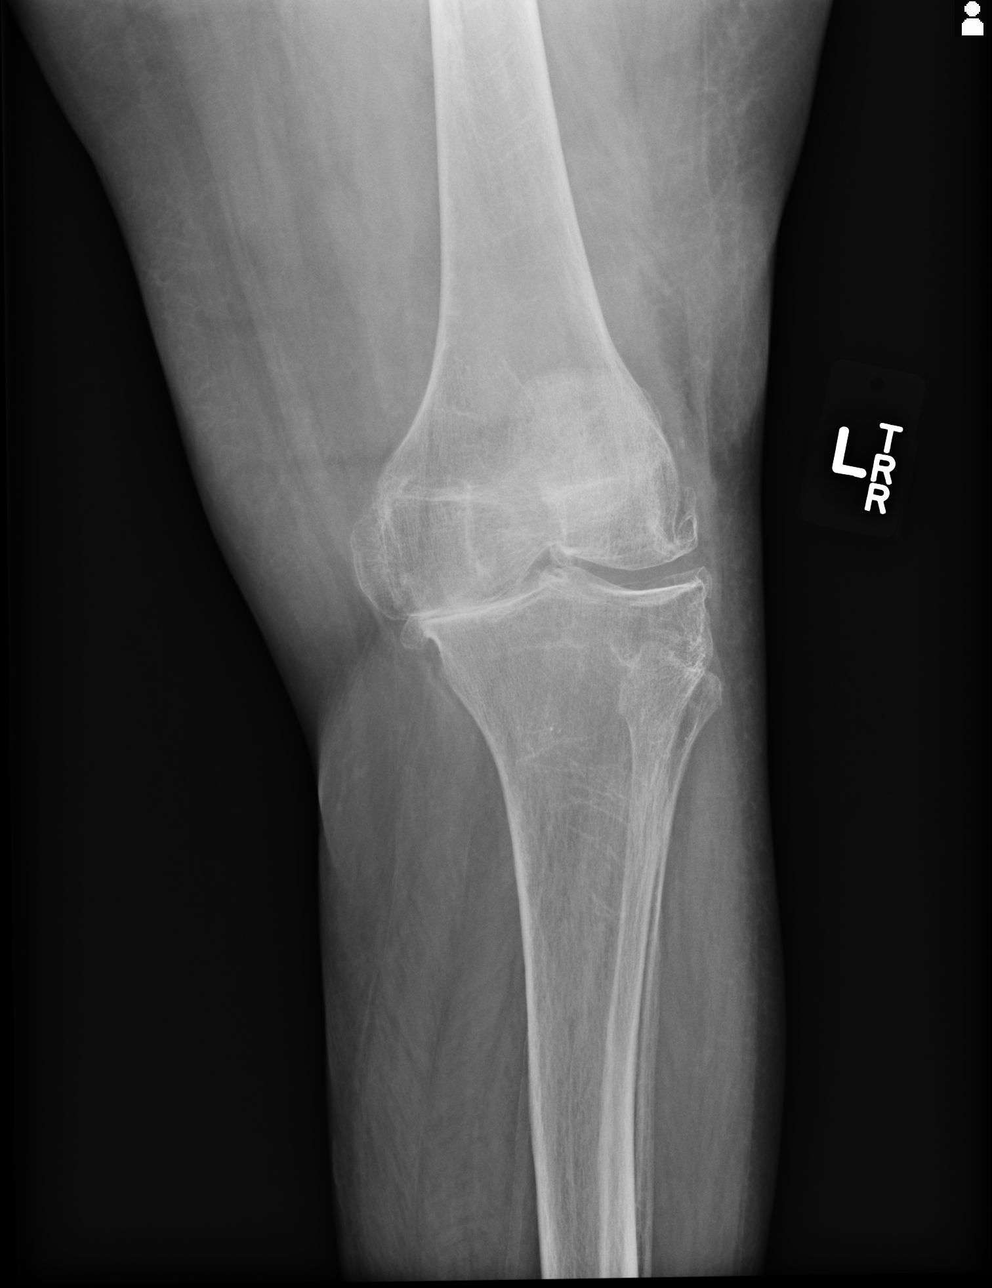
[im 3/4]
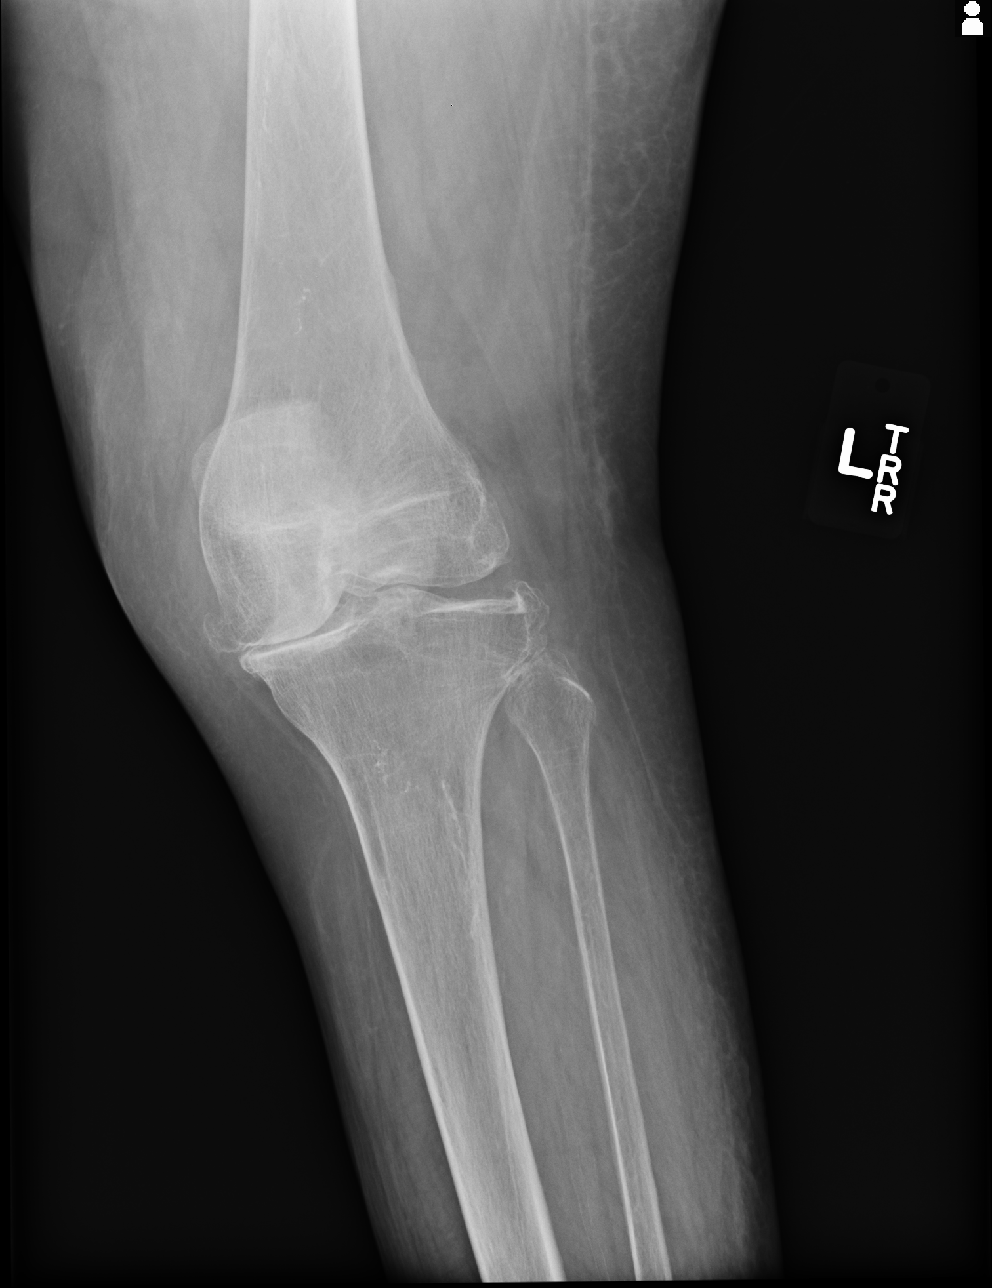
[im 4/4]
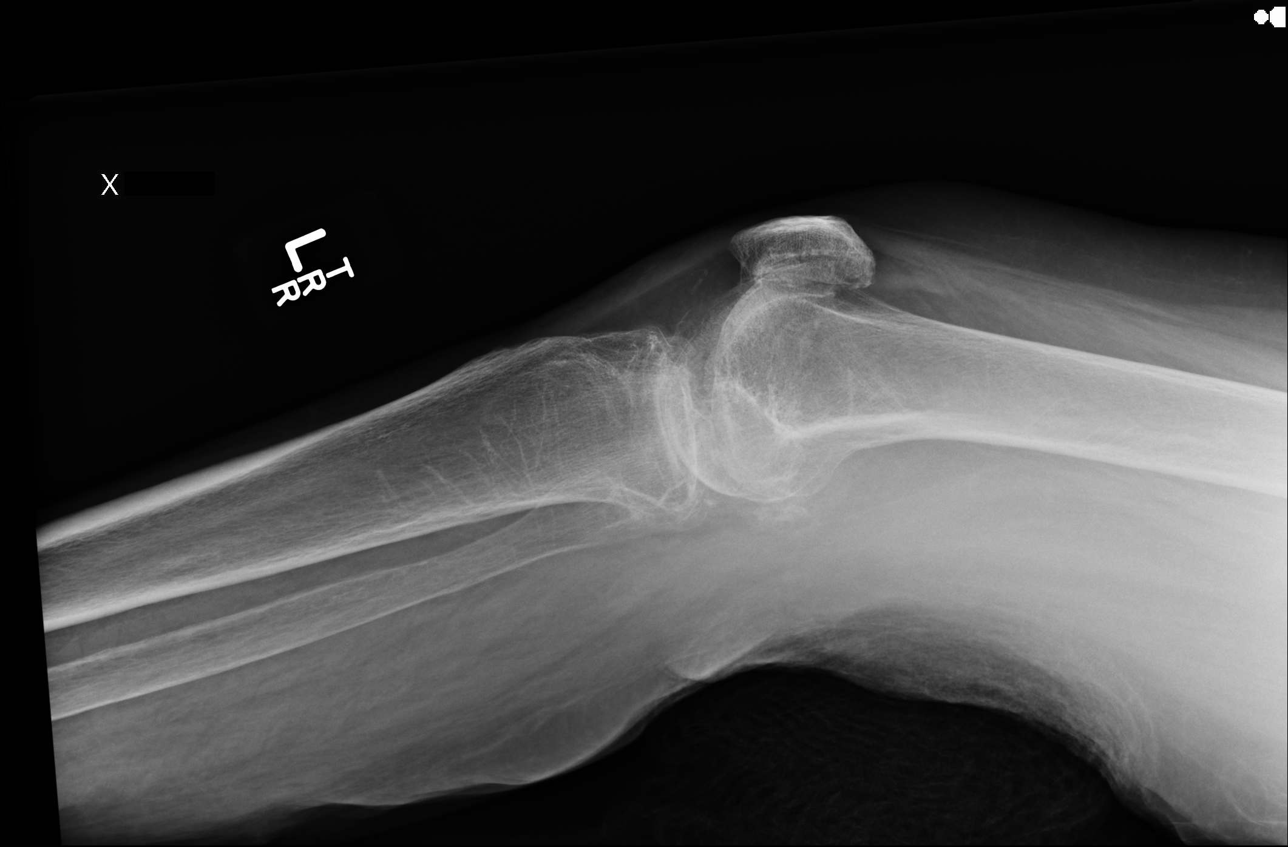

[4 of 4 positions shown; findings below may reference images not displayed]

FINDINGS: Tricompartmental degenerative changes, severe were along the medial
compartment. Diffuse osteopenia. Mild depression along the medial
tibial plateau, not significantly different from the prior. Small
joint effusion.
IMPRESSION: Tricompartmental degenerative changes and diffuse osteopenia. Mild
depression along the medial tibial plateau appears similar to prior.
Correlate for point tenderness. Mild impaction fracture is not
excluded.

## 2015-04-26 IMAGING — CR RIGHT TIBIA AND FIBULA - 2 VIEW
1 series · 2 of 2 positions shown · non-contrast
Comparison: 04/15/2012

CLINICAL DATA: Pain post fall.

EXAM:
RIGHT TIBIA AND FIBULA - 2 VIEW

[Series 1: ap · 0.17mm/px · 2 of 2 slices shown]
[im 1/2]
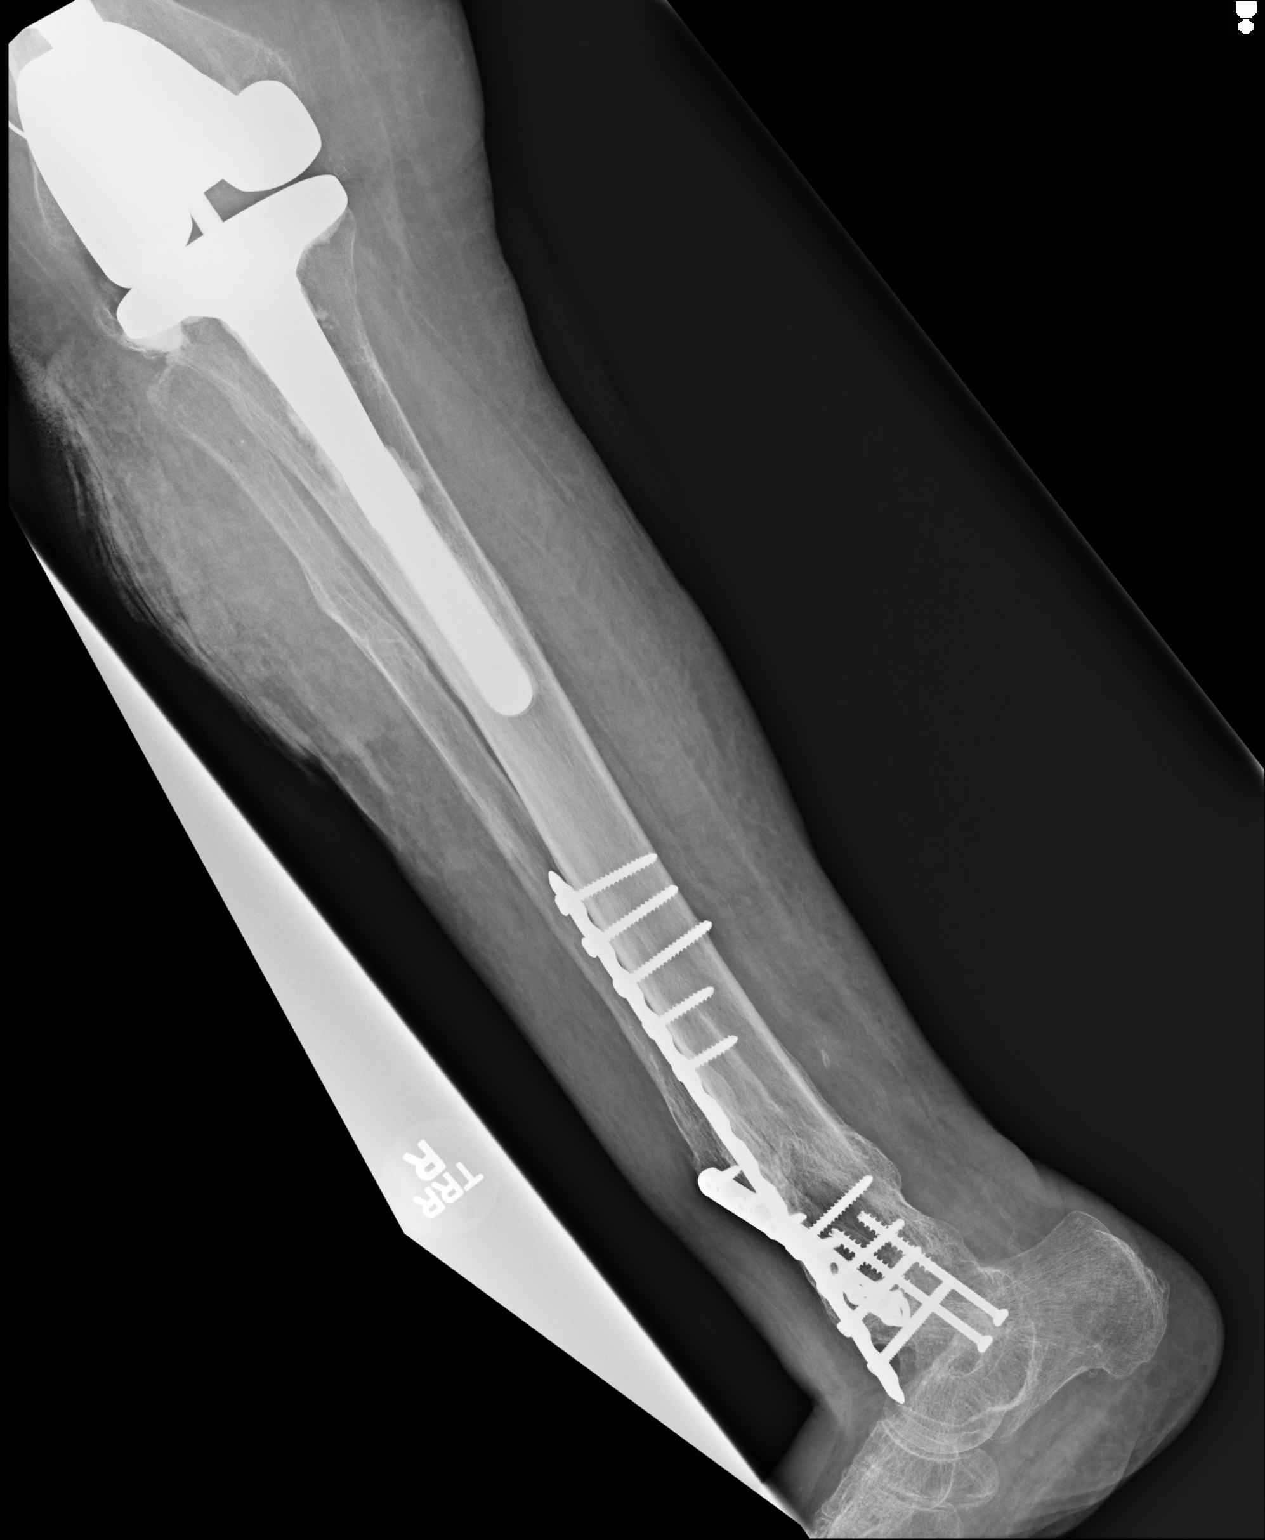
[im 2/2]
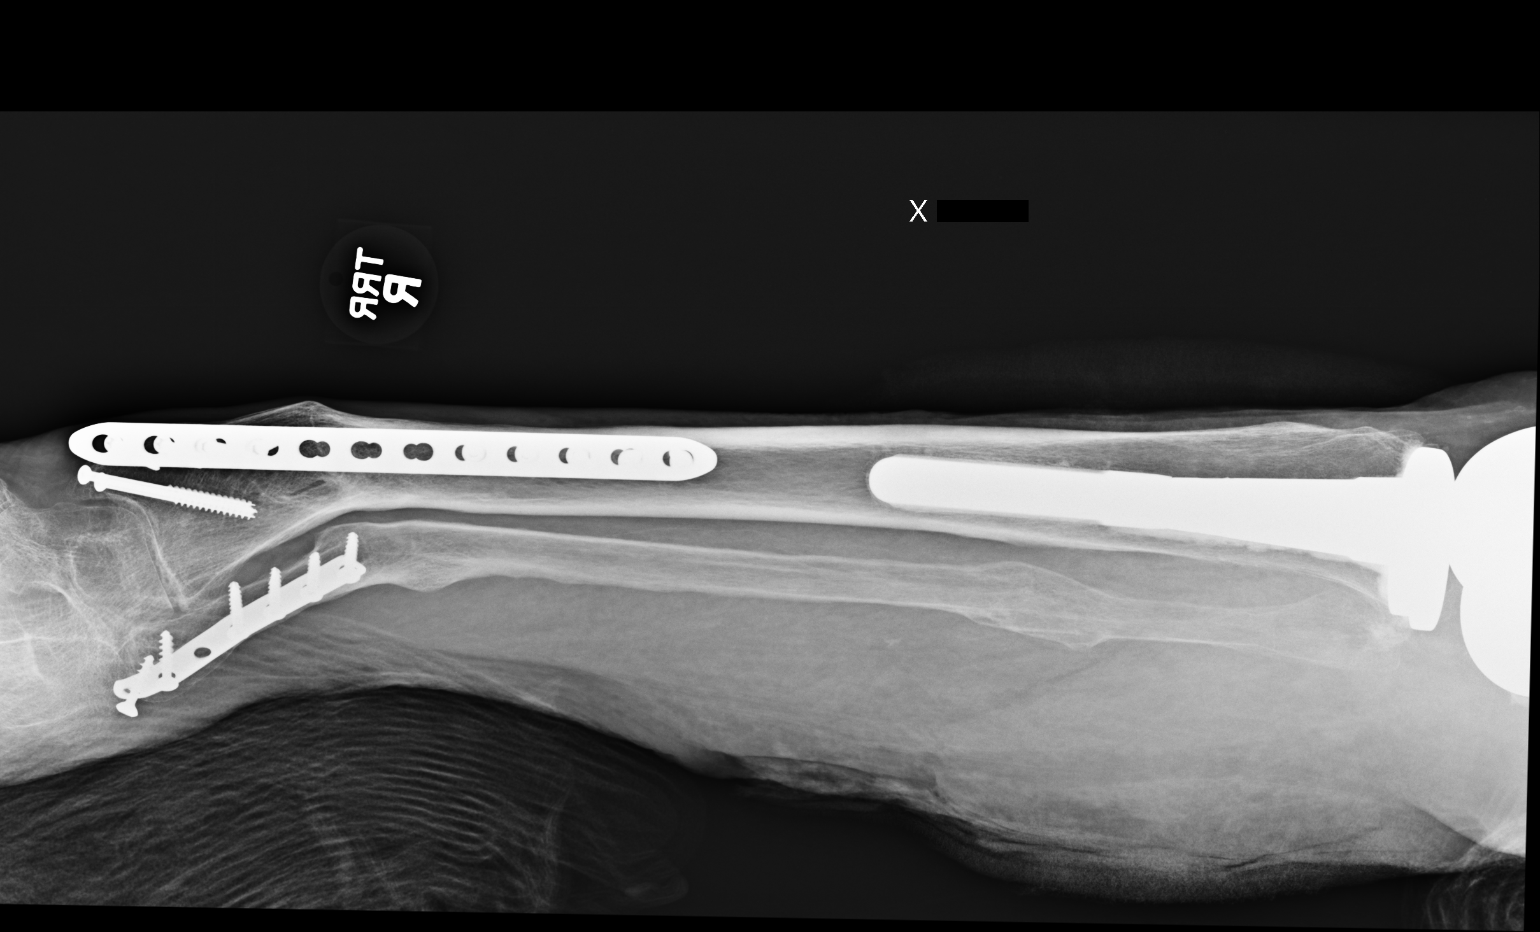

[2 of 2 positions shown; findings below may reference images not displayed]

FINDINGS: Knee arthroplasty is present and intact. There is an old proximal
fibular fracture. There is moderate diffuse osteopenia. Fixation
hardware is present over the distal tibia and fibula bridging old
fracture sites as hardware is intact and unchanged. There is stable
persistent lateral angulation distal to the old fracture sites.
There is no acute fracture.
IMPRESSION: No acute findings.

Diffuse osteopenia with hardware intact over old distal tibia and
fibular fractures sites.

## 2015-09-14 ENCOUNTER — Other Ambulatory Visit
Admission: RE | Admit: 2015-09-14 | Discharge: 2015-09-14 | Disposition: A | Discharge status: Home / Self Care | Payer: Medicare Other | Source: Ambulatory Visit | Attending: Physician Assistant | Admitting: Physician Assistant

## 2015-09-14 DIAGNOSIS — J9610 Chronic respiratory failure, unspecified whether with hypoxia or hypercapnia: Secondary | ICD-10-CM | POA: Diagnosis present

## 2015-09-14 LAB — URINALYSIS COMPLETE WITH MICROSCOPIC (ARMC ONLY)
BILIRUBIN URINE: NEGATIVE
Glucose, UA: NEGATIVE mg/dL
Ketones, ur: NEGATIVE mg/dL
Nitrite: NEGATIVE
PH: 5 (ref 5.0–8.0)
PROTEIN: 30 mg/dL — AB
SQUAMOUS EPITHELIAL / LPF: NONE SEEN
Specific Gravity, Urine: 1.015 (ref 1.005–1.030)

## 2015-09-16 LAB — URINE CULTURE

## 2015-10-06 DEATH — deceased

## 2016-07-03 IMAGING — CT CT HEAD WITHOUT CONTRAST
1 series · 15 of 30 positions shown, 19 images · non-contrast
Comparison: CT head without contrast 01/25/2014.

CLINICAL DATA: Unable to see out of the right high. Increased
confusion.

EXAM:
CT HEAD WITHOUT CONTRAST
TECHNIQUE: Contiguous axial images were obtained from the base of the skull
through the vertex without intravenous contrast.

[Series 2: head wo · axial · 0.42mm/px · z∈[+533,+688]mm · 15 of 36 slices shown, 19 images]
[im 2/36  brain]
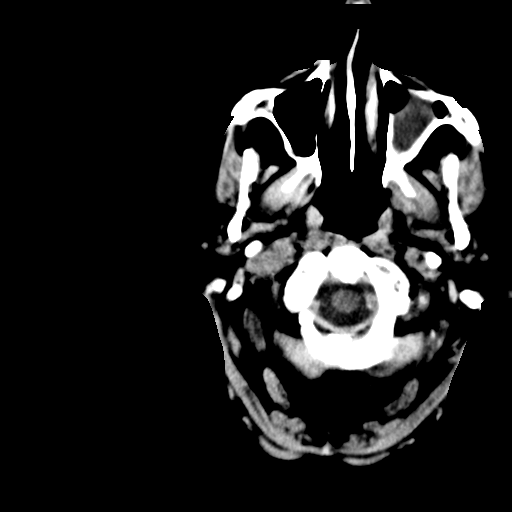
[im 2/36  bone]
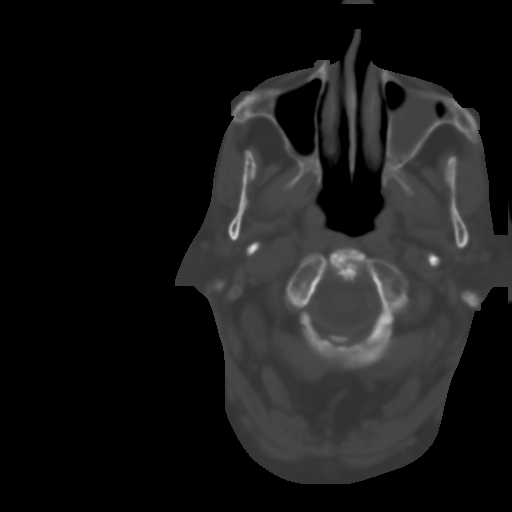
[im 4/36  brain]
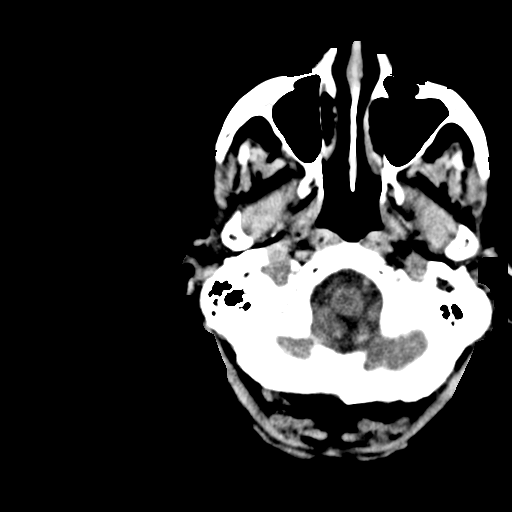
[im 7/36  brain]
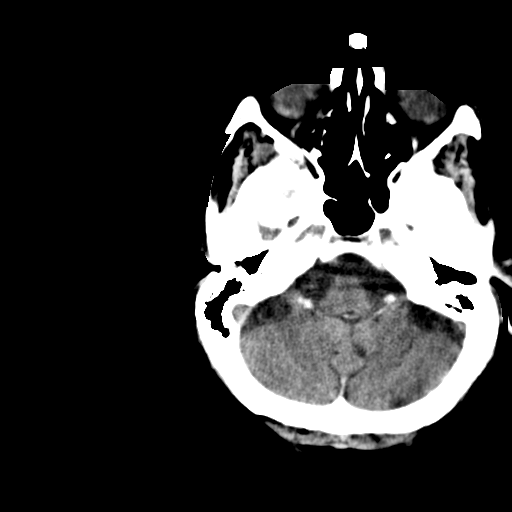
[im 9/36  brain]
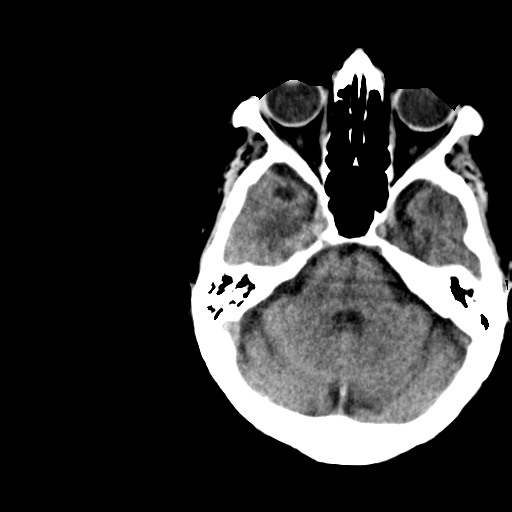
[im 11/36  brain]
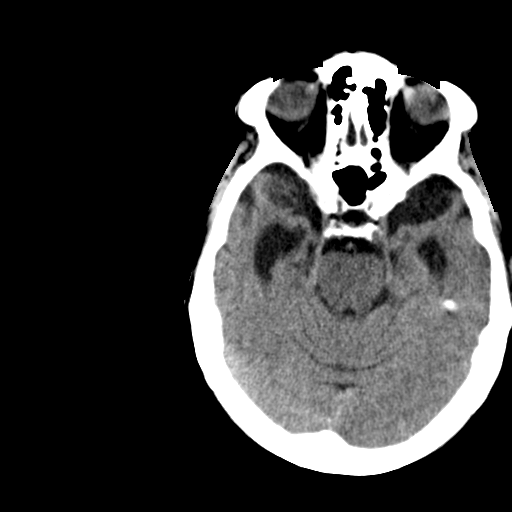
[im 11/36  bone]
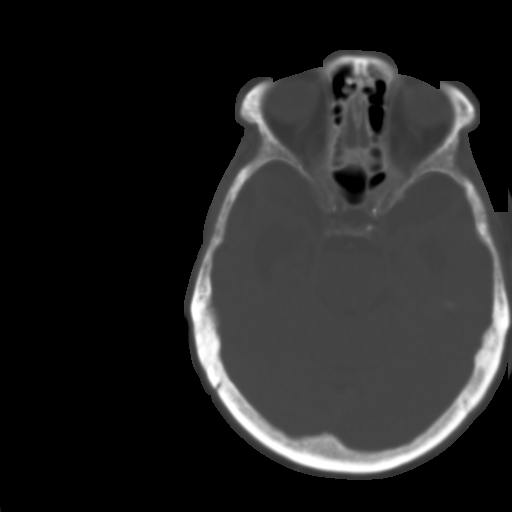
[im 14/36  brain]
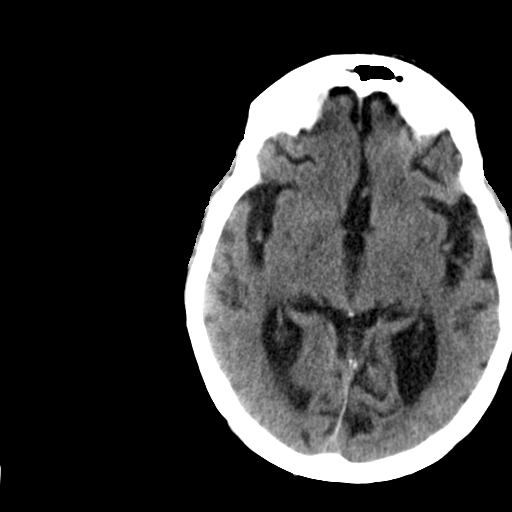
[im 16/36  brain]
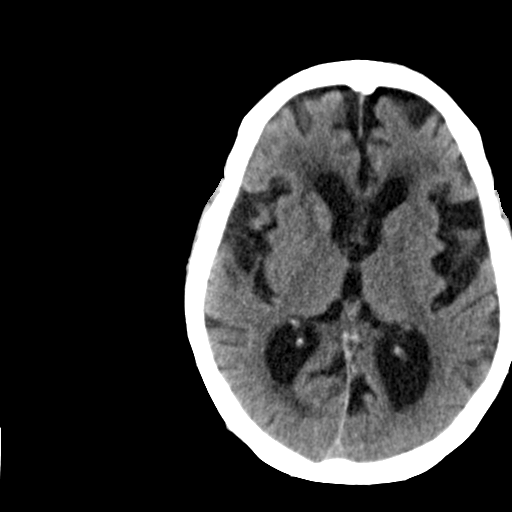
[im 19/36  brain]
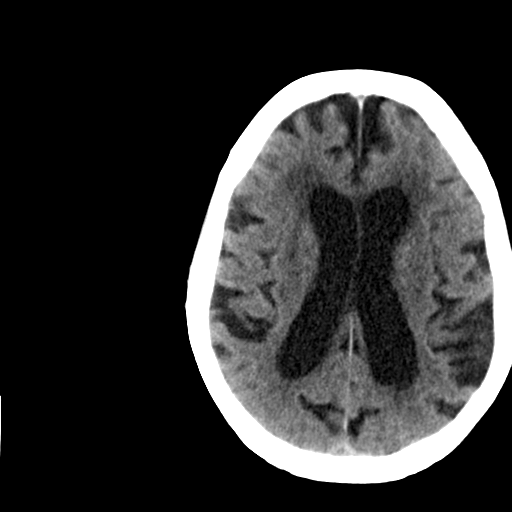
[im 20/36  brain]
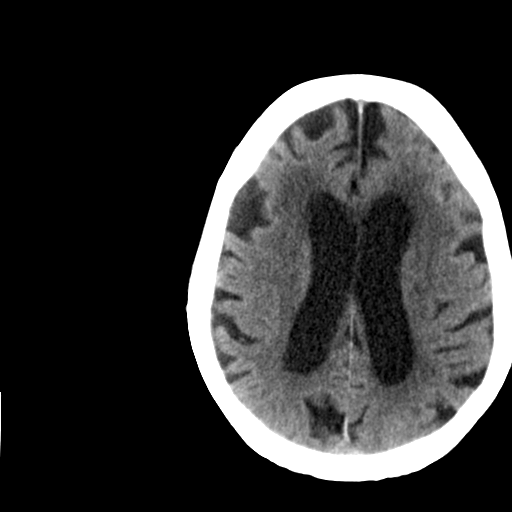
[im 20/36  bone]
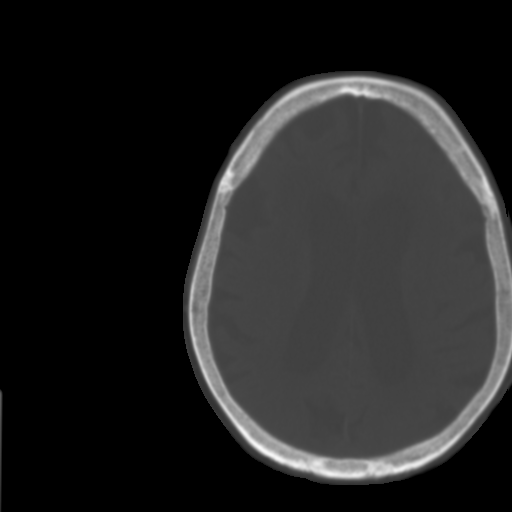
[im 22/36  brain]
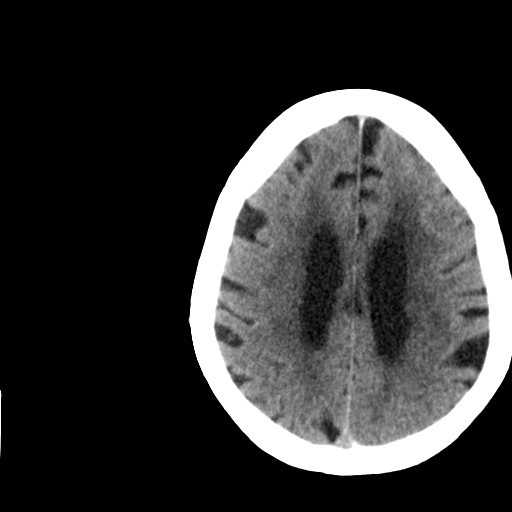
[im 25/36  brain]
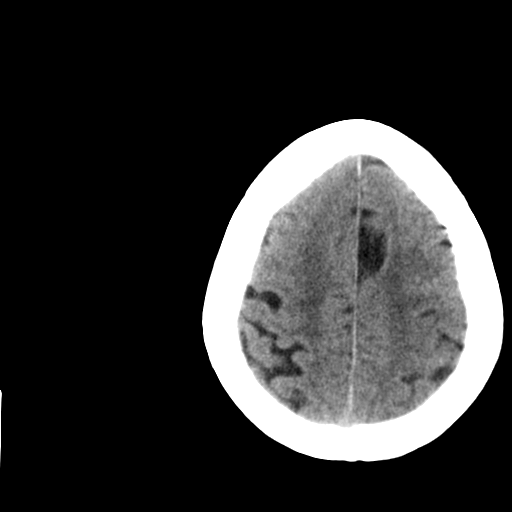
[im 27/36  brain]
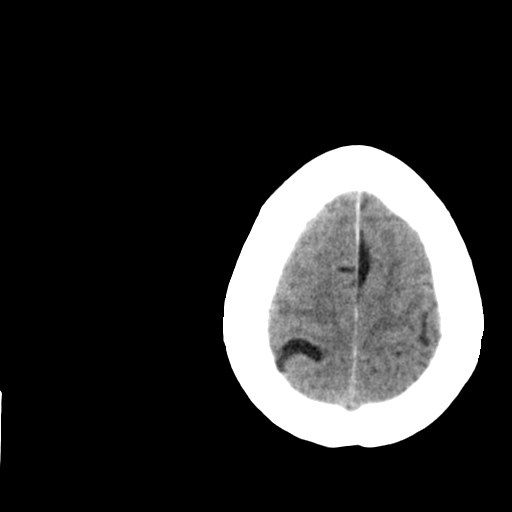
[im 29/36  brain]
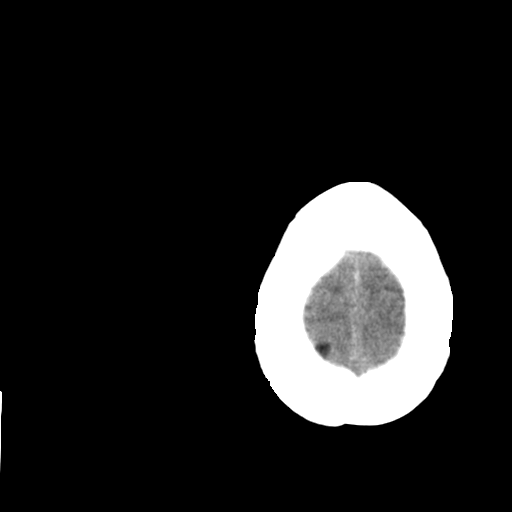
[im 29/36  bone]
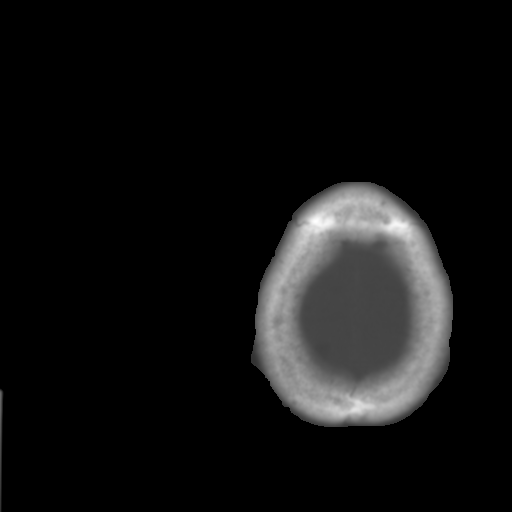
[im 32/36  brain]
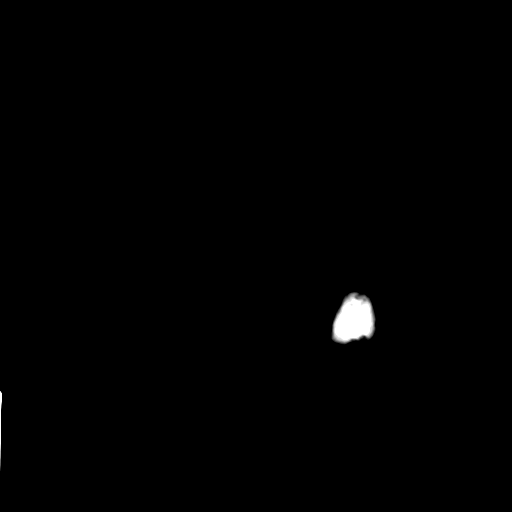
[im 34/36  brain]
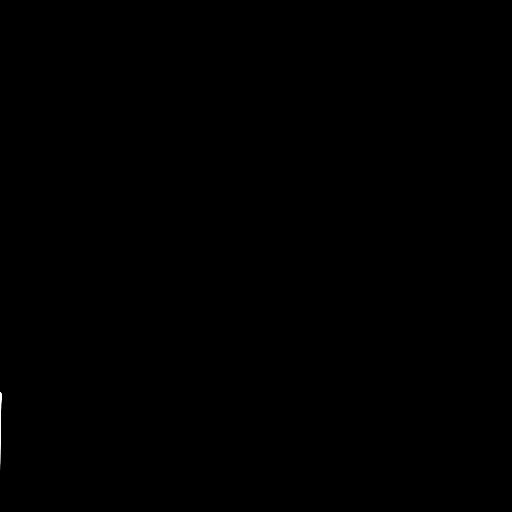

[15 of 30 positions shown; findings below may reference images not displayed]

FINDINGS: Advanced atrophy and extensive white matter changes are similar to
the prior study low no acute cortical infarct, hemorrhage, or mass
lesion is present. Extensive scratch the multiple remote lacunar
infarcts of the basal ganglia bilaterally are stable.

Ventricular enlargement is proportionate to the degree of atrophy.
No significant extra-axial fluid collection is present.

A chronic polyp or mucous retention cyst is present at floor of the
left maxillary sinus. The remaining paranasal sinuses are clear. A
chronic left mastoid effusion is noted. No obstructing
nasopharyngeal lesion is evident. A left lens replacements noted.
The globes and orbits are otherwise intact.
IMPRESSION: 1. Stable atrophy and diffuse white matter disease.
2. No acute intracranial abnormality.
